# Patient Record
Sex: Male | Born: 1965 | Race: White | Hispanic: No | Marital: Married | Smoking: Former smoker
Health system: Southern US, Community
[De-identification: ages and names within clinical notes are randomized; demographics above are authoritative.]

## PROBLEM LIST (undated history)

## (undated) DIAGNOSIS — N508 Other specified disorders of male genital organs: Secondary | ICD-10-CM

## (undated) DIAGNOSIS — Z87442 Personal history of urinary calculi: Secondary | ICD-10-CM

## (undated) DIAGNOSIS — M549 Dorsalgia, unspecified: Secondary | ICD-10-CM

## (undated) DIAGNOSIS — H538 Other visual disturbances: Secondary | ICD-10-CM

## (undated) DIAGNOSIS — N189 Chronic kidney disease, unspecified: Secondary | ICD-10-CM

## (undated) HISTORY — PX: OTHER SURGICAL HISTORY: SHX169

## (undated) HISTORY — DX: Personal history of urinary calculi: Z87.442

## (undated) HISTORY — DX: Dorsalgia, unspecified: M54.9

## (undated) HISTORY — PX: APPENDECTOMY: SHX54

## (undated) HISTORY — DX: Other visual disturbances: H53.8

## (undated) HISTORY — DX: Other specified disorders of male genital organs: N50.8

## (undated) HISTORY — DX: Chronic kidney disease, unspecified: N18.9

---

## 2002-11-06 ENCOUNTER — Encounter: Payer: Self-pay | Admitting: Emergency Medicine

## 2002-11-06 ENCOUNTER — Emergency Department (HOSPITAL_COMMUNITY): Admission: EM | Admit: 2002-11-06 | Discharge: 2002-11-06 | Payer: Self-pay | Admitting: Emergency Medicine

## 2002-11-21 ENCOUNTER — Emergency Department (HOSPITAL_COMMUNITY): Admission: EM | Admit: 2002-11-21 | Discharge: 2002-11-22 | Payer: Self-pay | Admitting: Emergency Medicine

## 2002-11-22 ENCOUNTER — Encounter: Payer: Self-pay | Admitting: Emergency Medicine

## 2005-01-10 ENCOUNTER — Emergency Department (HOSPITAL_COMMUNITY): Admission: EM | Admit: 2005-01-10 | Discharge: 2005-01-10 | Payer: Self-pay | Admitting: Emergency Medicine

## 2005-01-17 ENCOUNTER — Ambulatory Visit: Payer: Self-pay | Admitting: Internal Medicine

## 2005-01-21 ENCOUNTER — Ambulatory Visit (HOSPITAL_COMMUNITY): Admission: RE | Admit: 2005-01-21 | Discharge: 2005-01-21 | Payer: Self-pay | Admitting: Internal Medicine

## 2005-10-25 ENCOUNTER — Ambulatory Visit: Payer: Self-pay | Admitting: Internal Medicine

## 2006-09-25 ENCOUNTER — Encounter (INDEPENDENT_AMBULATORY_CARE_PROVIDER_SITE_OTHER): Payer: Self-pay | Admitting: *Deleted

## 2006-09-25 ENCOUNTER — Observation Stay (HOSPITAL_COMMUNITY): Admission: EM | Admit: 2006-09-25 | Discharge: 2006-09-25 | Payer: Self-pay | Admitting: *Deleted

## 2007-11-16 IMAGING — CT CT ABDOMEN W/ CM
2 of 5 series · 17 of 46 positions shown, 19 images · IV contrast (omnipaque)
Comparison: none

CLINICAL DATA: Abdominal pain and right lower quadrant pain.  Leukocytosis. 
 ABDOMEN CT WITH CONTRAST:
TECHNIQUE: Multidetector CT imaging of the abdomen was performed following the standard protocol during bolus administration of intravenous contrast.
 Contrast:  100 cc Omnipaque 300
TECHNIQUE: Multidetector CT imaging of the pelvis was performed following the standard protocol during bolus administration of intravenous contrast.

[Series 2: abd_pel 5.0 b40s · axial · 0.68mm/px · z∈[-626,-236]mm · 14 of 88 slices shown, 16 images]
[im 5/88  soft-tissue]
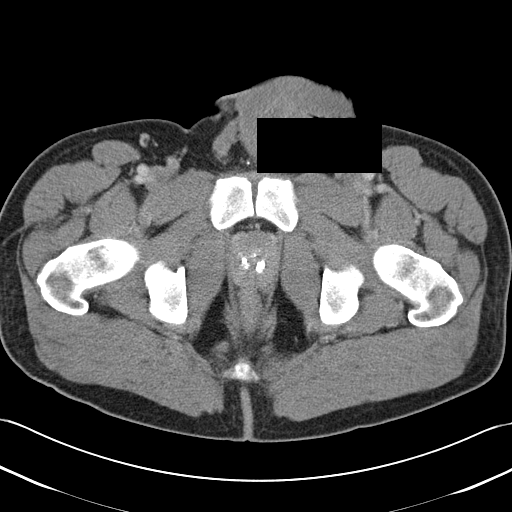
[im 5/88  bone]
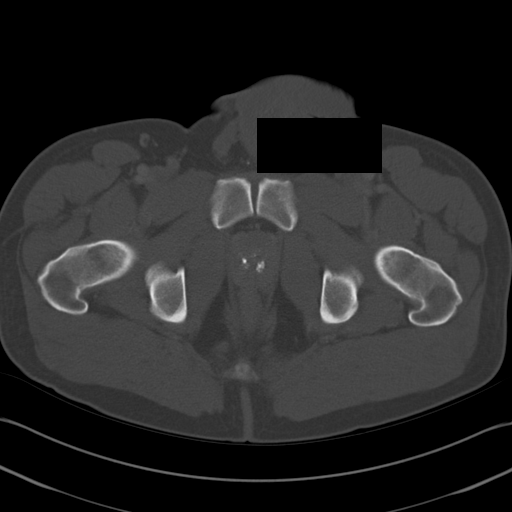
[im 14/88  soft-tissue]
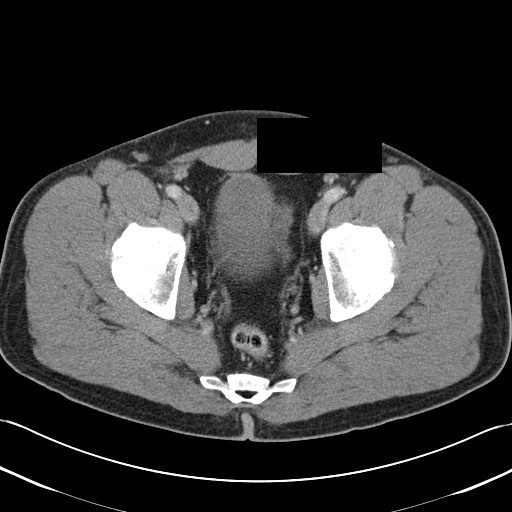
[im 18/88  soft-tissue]
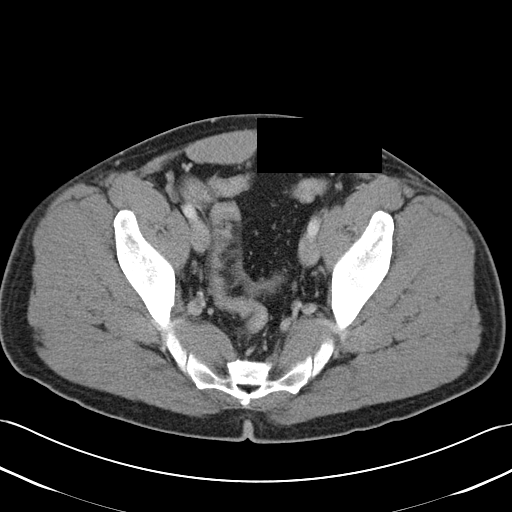
[im 22/88  soft-tissue]
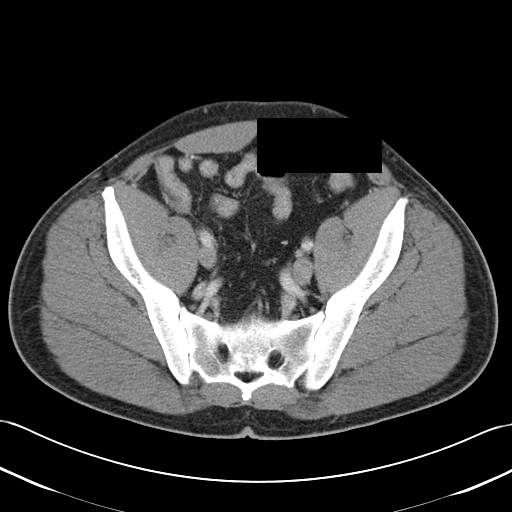
[im 31/88  soft-tissue]
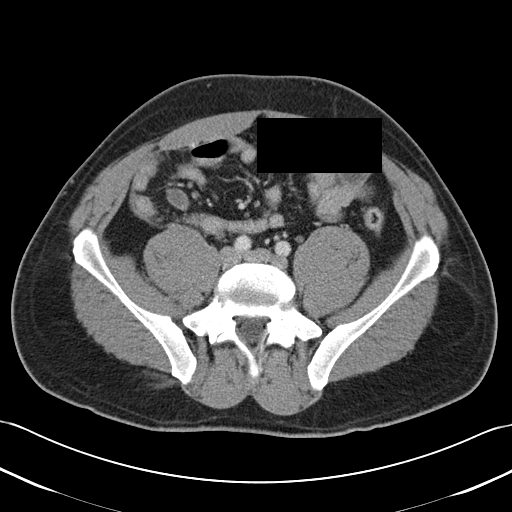
[im 35/88  soft-tissue]
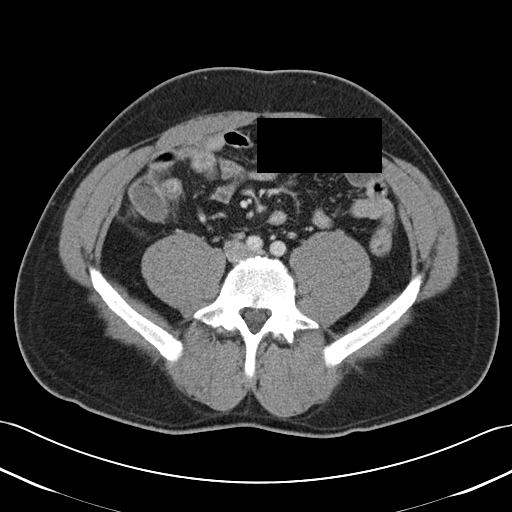
[im 40/88  soft-tissue]
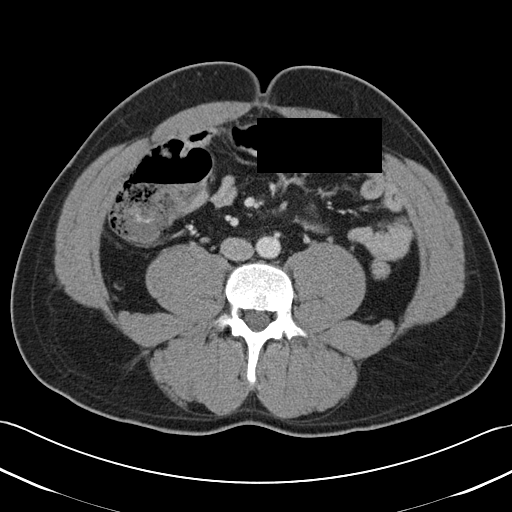
[im 48/88  soft-tissue]
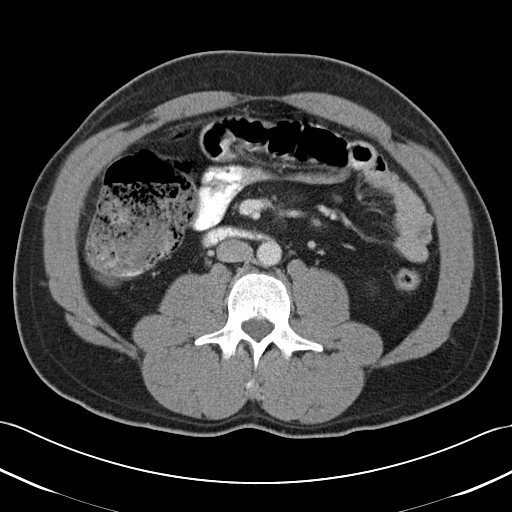
[im 53/88  soft-tissue]
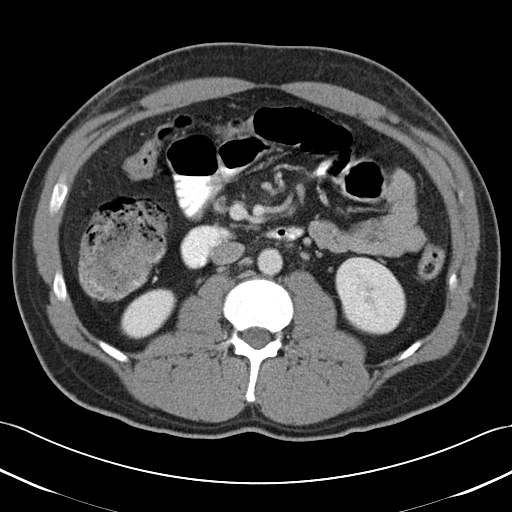
[im 53/88  bone]
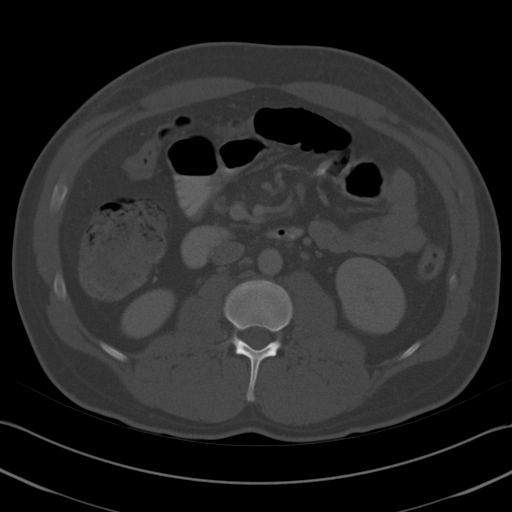
[im 57/88  soft-tissue]
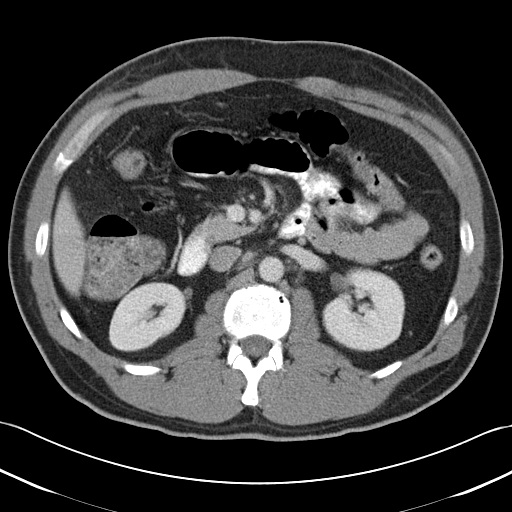
[im 66/88  soft-tissue]
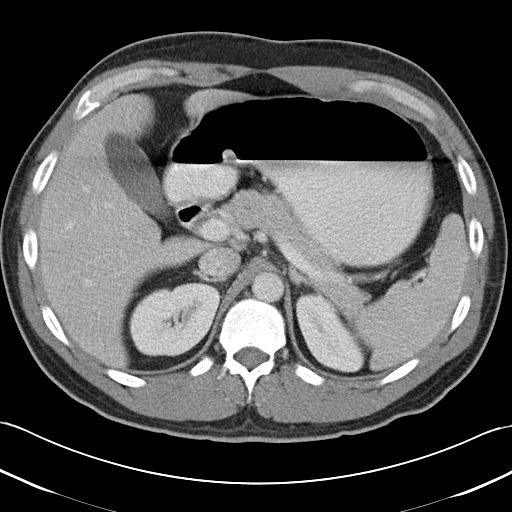
[im 70/88  soft-tissue]
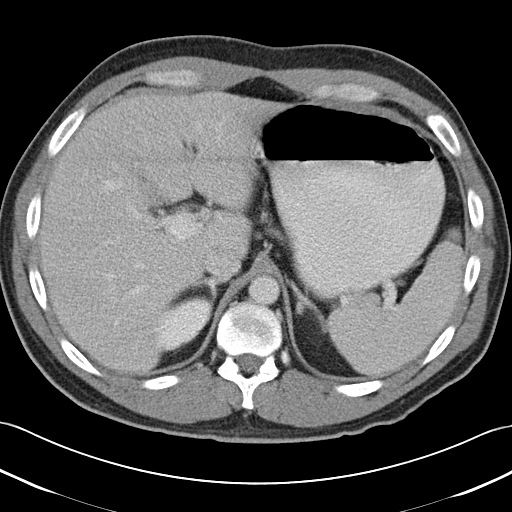
[im 74/88  soft-tissue]
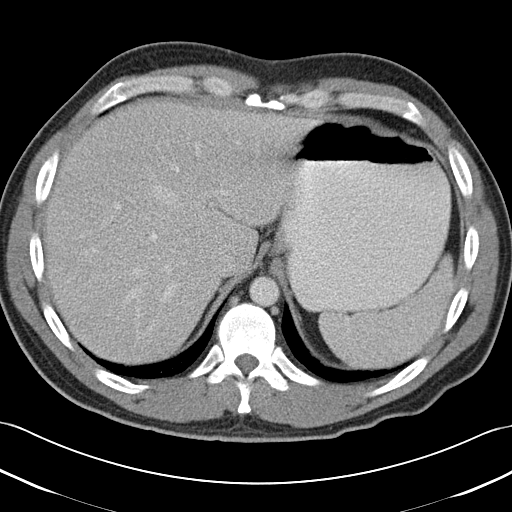
[im 83/88  soft-tissue]
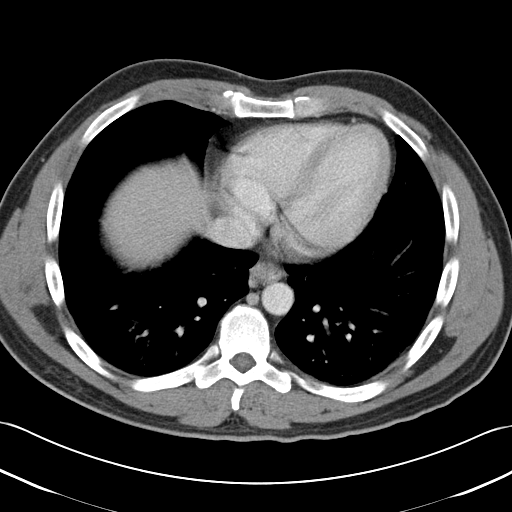

[Series 602: <mpr range> · coronal · 0.86mm/px · 3 of 71 slices shown]
[im 24/71  soft-tissue]
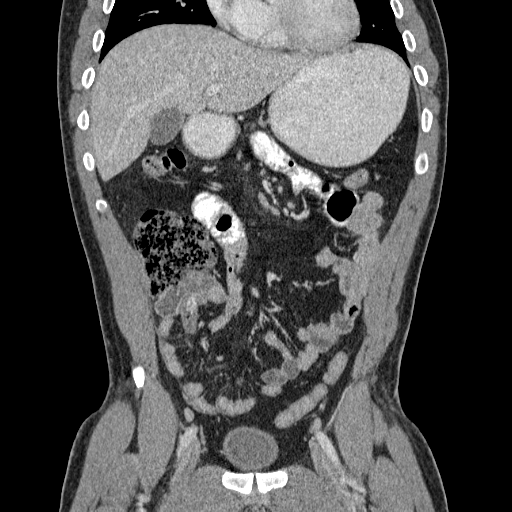
[im 32/71  soft-tissue]
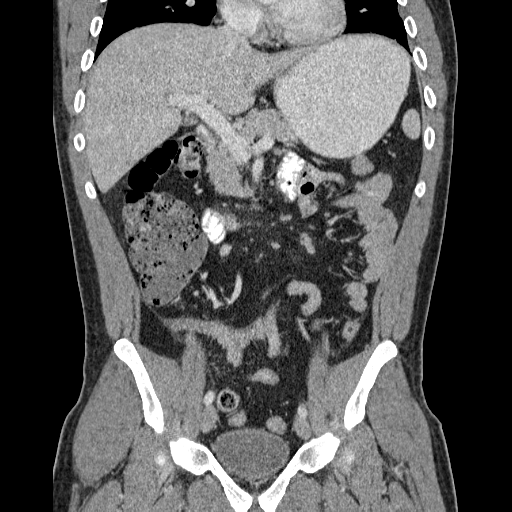
[im 39/71  soft-tissue]
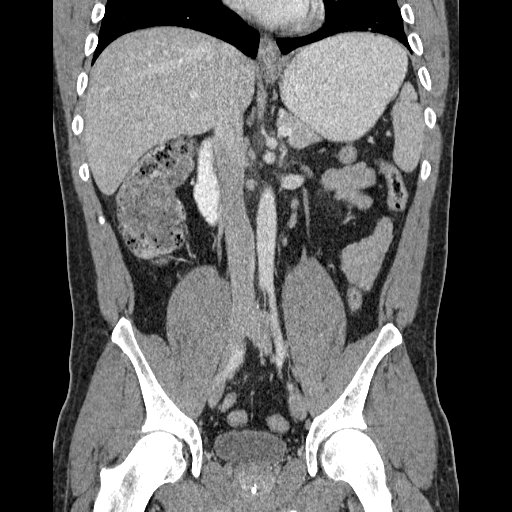

[17 of 46 positions shown; findings below may reference images not displayed]

FINDINGS: The liver and spleen are unremarkable.  Stomach is distended with oral contrast.  The duodenum, pancreas, gallbladder, adrenal glands, and kidneys have normal imaging features.
IMPRESSION: No acute findings in the abdomen.  
 PELVIS CT WITH CONTRAST:
FINDINGS: There is no intraperitoneal free fluid.  No pelvic lymphadenopathy.  The appendix measures 15 mm in diameter and is fluid-filled.  A calcified appendicolith is noted in the base of the appendix.  There is mild periappendiceal inflammation and the lumen is fluid-filled.  
 The terminal ileum is normal in appearance.  Dystrophic calcification is identified within the prostate gland.
IMPRESSION: Features consistent with acute appendicitis and a stone in the base of the appendix.  No evidence for a perforation or periappendiceal abscess at this time.  Results were called directly to Dr. Genaro in the emergency room at the time of study interpretation.

## 2008-03-28 ENCOUNTER — Ambulatory Visit: Payer: Self-pay | Admitting: Internal Medicine

## 2008-03-28 DIAGNOSIS — M549 Dorsalgia, unspecified: Secondary | ICD-10-CM | POA: Insufficient documentation

## 2008-03-28 DIAGNOSIS — N508 Other specified disorders of male genital organs: Secondary | ICD-10-CM | POA: Insufficient documentation

## 2008-03-28 DIAGNOSIS — Z87442 Personal history of urinary calculi: Secondary | ICD-10-CM | POA: Insufficient documentation

## 2008-03-28 HISTORY — DX: Other specified disorders of male genital organs: N50.8

## 2008-03-28 HISTORY — DX: Dorsalgia, unspecified: M54.9

## 2008-03-28 HISTORY — DX: Personal history of urinary calculi: Z87.442

## 2008-04-11 ENCOUNTER — Encounter: Admission: RE | Admit: 2008-04-11 | Discharge: 2008-04-11 | Payer: Self-pay | Admitting: Internal Medicine

## 2009-06-02 IMAGING — US US SCROTUM
1 series · 14 of 25 positions shown · non-contrast
Comparison: None.

CLINICAL DATA: Palpable abnormality in the right hemiscrotum for
approximately 2 months which has not changed over that time.

ULTRASOUND OF SCROTUM 04/11/2008:
TECHNIQUE: Complete ultrasound examination of the testicles,
epididymis, and other scrotal structures was performed.

[Series 1: us scrotum · 0.07mm/px · 14 of 46 slices shown]
[im 1/46]
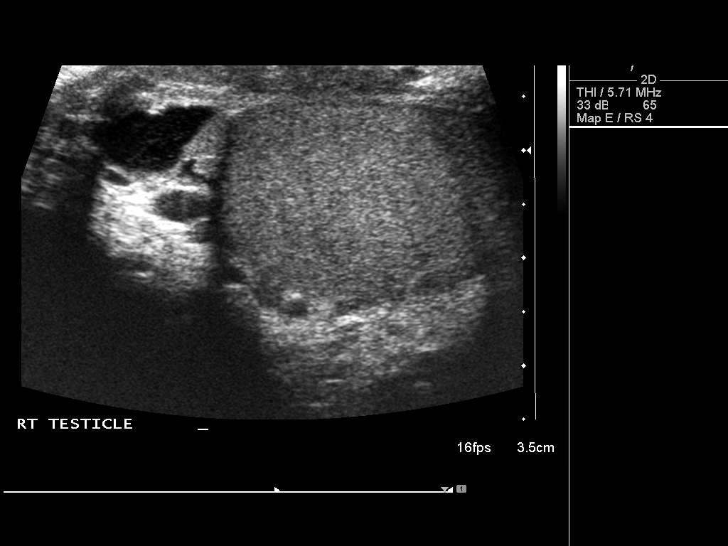
[im 4/46]
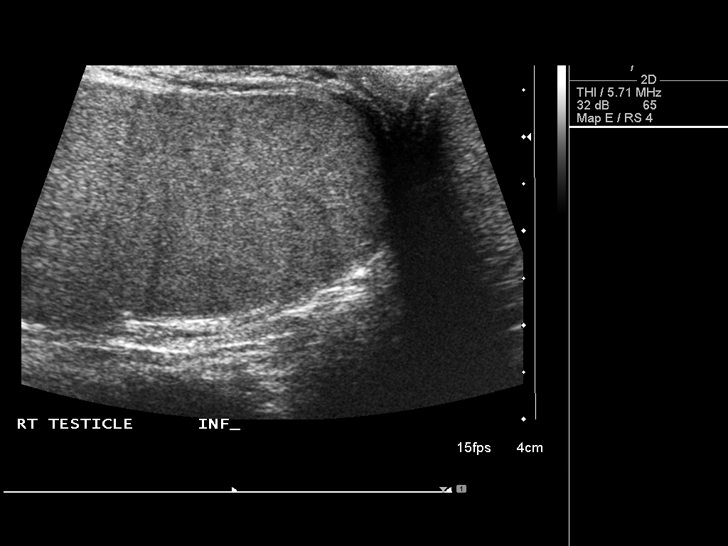
[im 8/46]
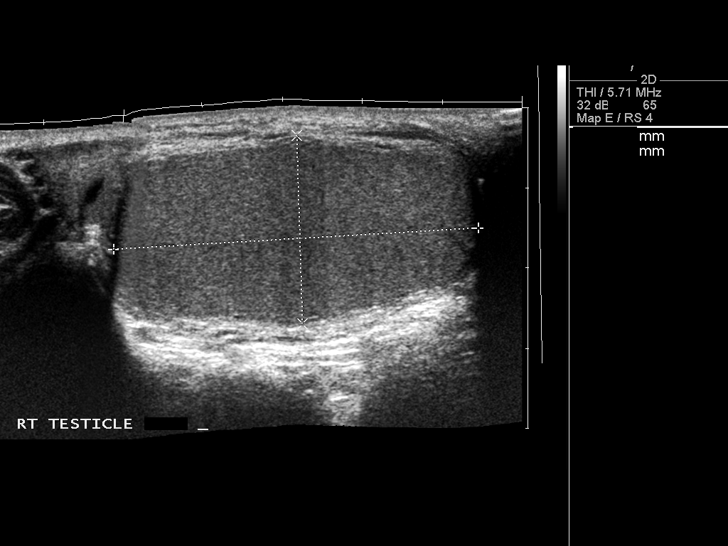
[im 12/46]
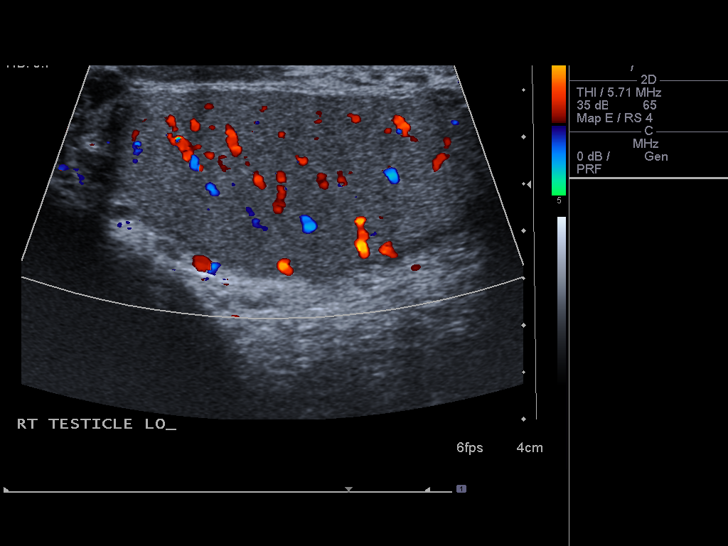
[im 16/46]
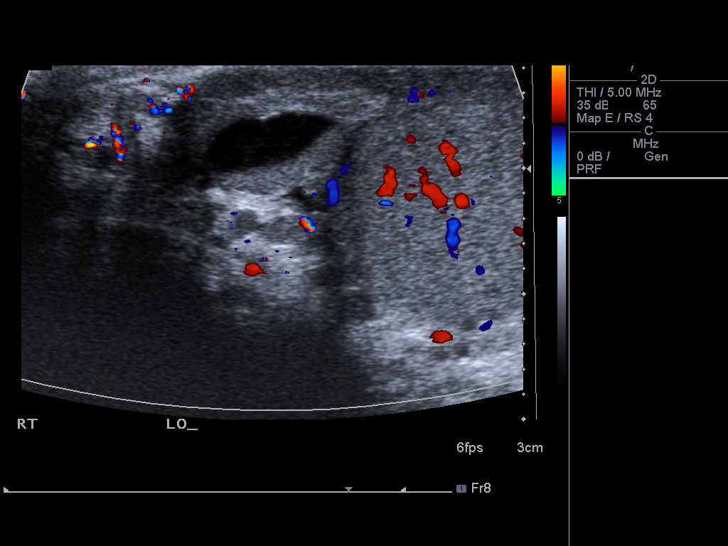
[im 17/46]
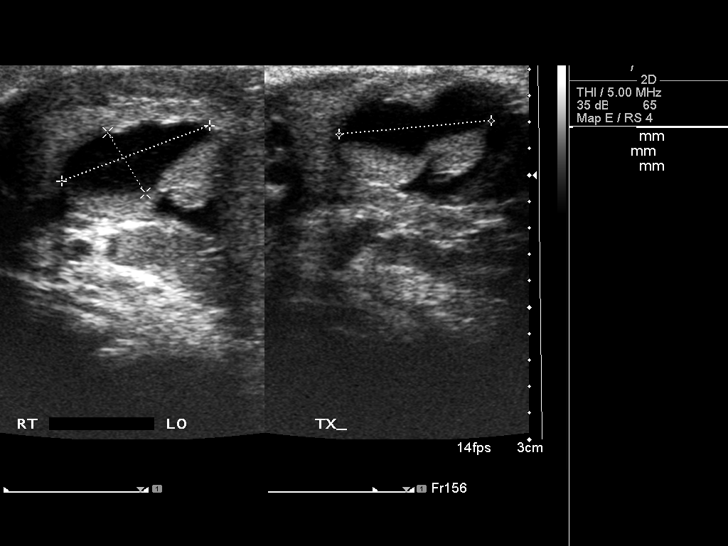
[im 21/46]
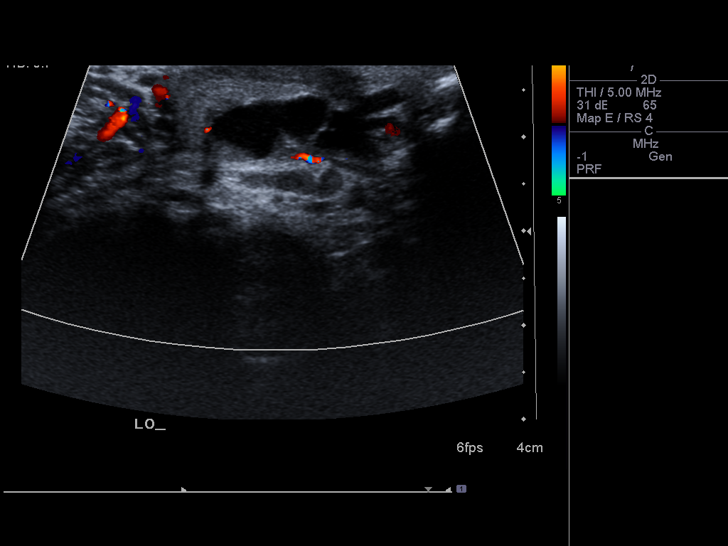
[im 25/46]
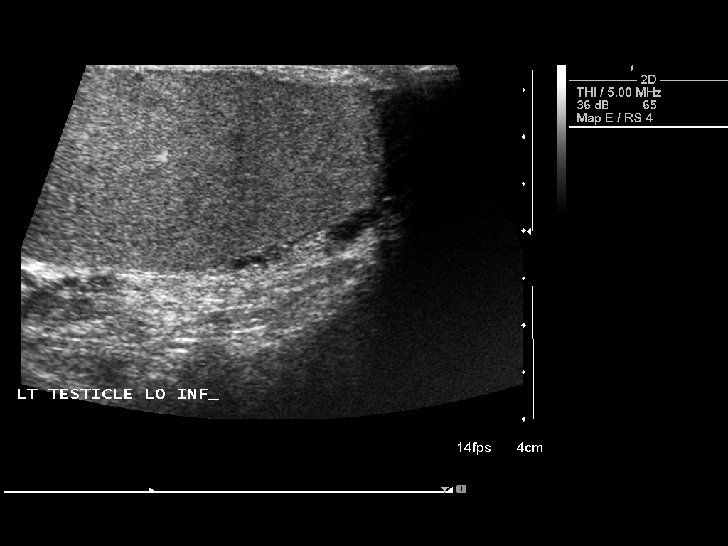
[im 29/46]
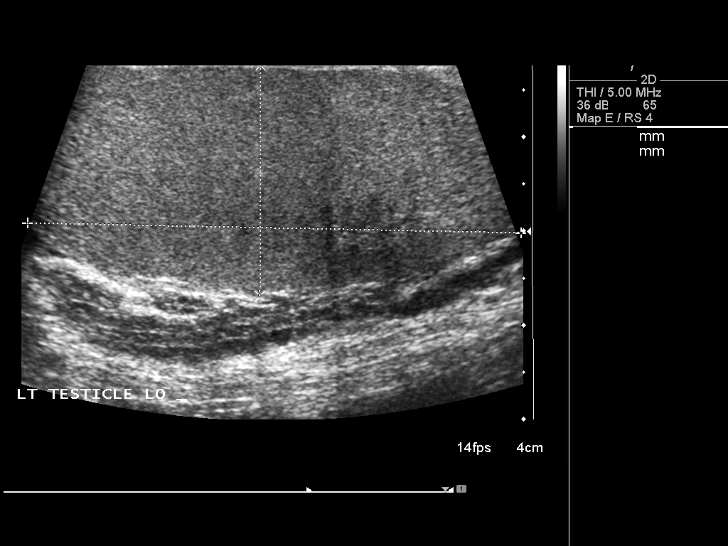
[im 31/46]
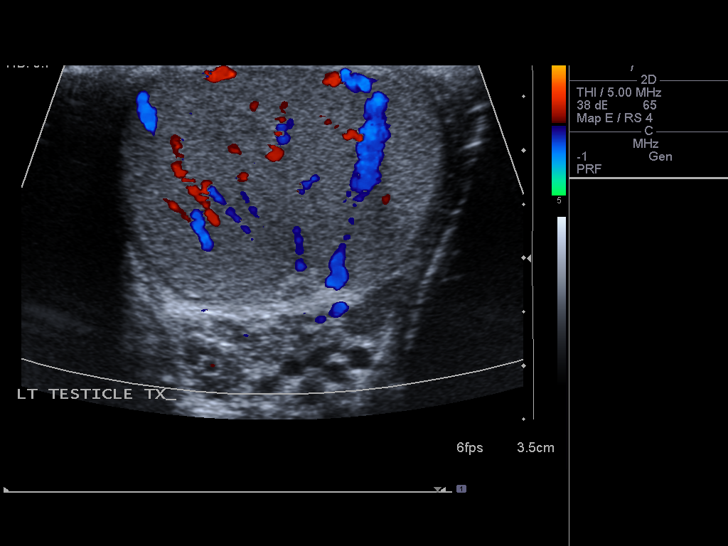
[im 34/46]
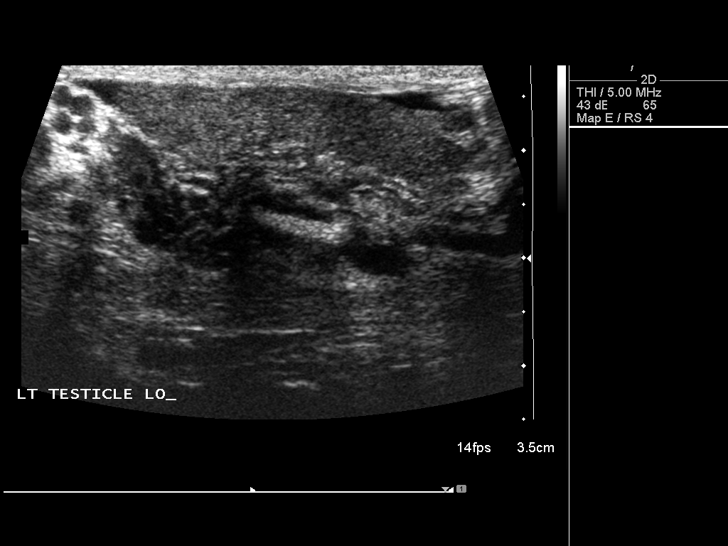
[im 38/46]
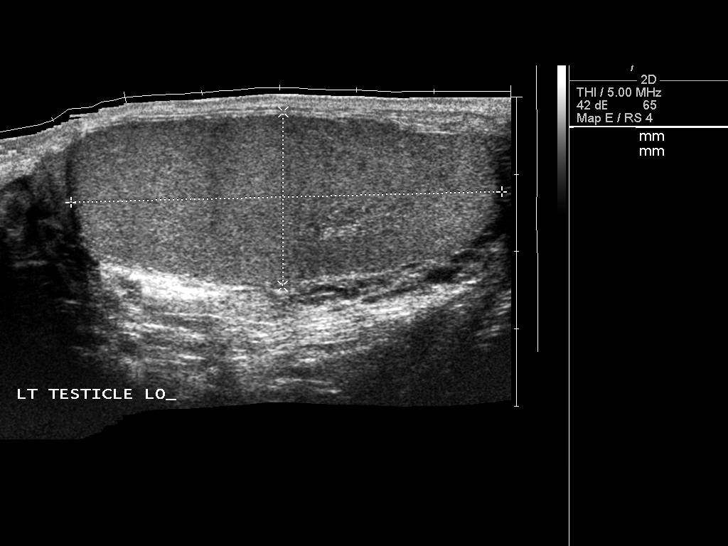
[im 42/46]
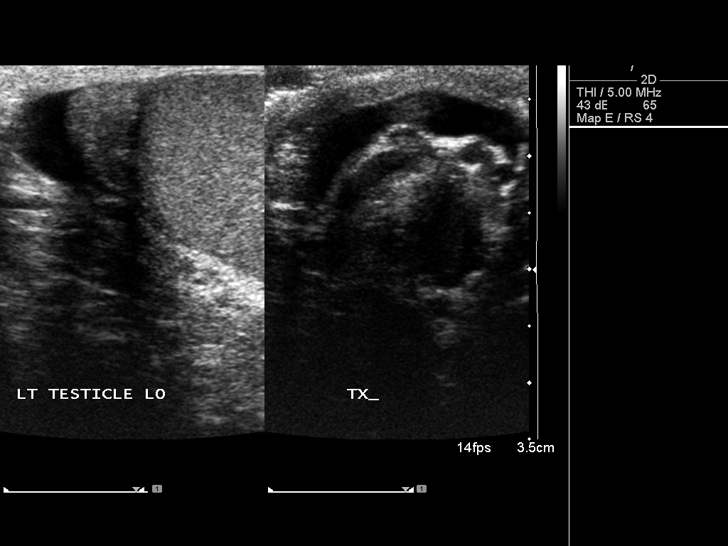
[im 46/46]
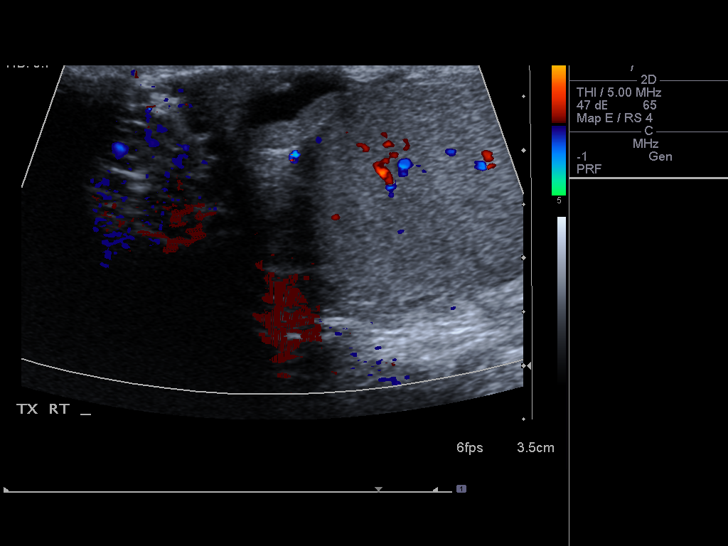

[14 of 25 positions shown; findings below may reference images not displayed]

FINDINGS: Right testicle normal in size and echotexture without
focal parenchymal abnormality, measuring approximately 4.7 x 2.2 x
3.3 cm.  Normal color Doppler signal identified..

Several cystic foci in the head of the right epididymis, the
largest approximating 11 mm. No evidence of hyperemia involving the
right epididymis.

Left testicle normal in size and echotexture measuring
approximately 5.6 x 2.3 x 3.1 cm.  Normal color Doppler signal
identified.

Left epididymis normal in appearance without evidence of hyperemia.

No evidence of hydrocele on either side.  No evidence of varicocele
with Valsalva.
IMPRESSION: 1.  Several cysts or spermatoceles in the right epididymis, likely
accounting for the palpable abnormality in the right scrotum.
Please correlate clinically.
2.  Otherwise normal scrotal ultrasound.

## 2010-07-20 NOTE — Assessment & Plan Note (Signed)
Summary: MEN PROBLEM/$50/PN   Vital Signs:  Patient Profile:   45 Years Old Male Weight:      175 pounds Temp:     98.0 degrees F oral Pulse rate:   88 / minute BP sitting:   118 / 68  (left arm) Cuff size:   regular  Vitals Entered By: Payton Spark CMA (March 28, 2008 9:18 AM)                 Chief Complaint:  man problems.  History of Present Illness: here with 2 mo lump that occasionally is somewhat sore by the end of the workday just above the right testicle area; no other GU symptoms; also with 1 wk left lower back pain without tenderness or radicular, but seems worse to first get up in the am with stiffness and mild discomfort, better at the end of the day    Updated Prior Medication List: DICLOFENAC SODIUM 75 MG TBEC (DICLOFENAC SODIUM) 1 by mouth two times a day as needed  Current Allergies (reviewed today): No known allergies   Past Medical History:    Reviewed history and no changes required:       Nephrolithiasis, hx of x 3  Past Surgical History:    Reviewed history and no changes required:       Inguinal herniorrhaphy       Appendectomy       s/p tubes in ears as child   Family History:    Reviewed history and no changes required:       father with renal stones, HTN       grandmother with breast cancer  Social History:    Reviewed history and no changes required:       Married       1 child       work - Occupational hygienist       Current Smoker       Alcohol use-yes   Risk Factors:  Tobacco use:  current Alcohol use:  yes   Review of Systems       all otherwise negative    Physical Exam  General:     alert and well-developed.   Head:     Normocephalic and atraumatic without obvious abnormalities. No apparent alopecia or balding. Eyes:     No corneal or conjunctival inflammation noted. EOMI. Perrla.  Ears:     External ear exam shows no significant lesions or deformities.  Otoscopic examination reveals clear  canals, tympanic membranes are intact bilaterally without bulging, retraction, inflammation or discharge. Hearing is grossly normal bilaterally. Nose:     External nasal examination shows no deformity or inflammation. Nasal mucosa are pink and moist without lesions or exudates. Mouth:     Oral mucosa and oropharynx without lesions or exudates.  Teeth in good repair. Neck:     No deformities, masses, or tenderness noted. Lungs:     Normal respiratory effort, chest expands symmetrically. Lungs are clear to auscultation, no crackles or wheezes. Heart:     Normal rate and regular rhythm. S1 and S2 normal without gallop, murmur, click, rub or other extra sounds. Genitalia:     normal except for < 1 cm nontender but not hard lump near the epididymus just above the right testicle Msk:     lumbar bilat nontender paravertebral area, FROM Extremities:     no edema, no ulcers  Neurologic:     cranial nerves II-XII intact and strength  normal in all extremities.      Impression & Recommendations:  Problem # 1:  BACK PAIN (ICD-724.5)  His updated medication list for this problem includes:    Diclofenac Sodium 75 Mg Tbec (Diclofenac sodium) .Marland Kitchen... 1 by mouth two times a day as needed treat as above, f/u any worsening signs or symptoms , c/w MSK strain vs DJD lumbar  Problem # 2:  SCROTAL MASS (ICD-608.89) check u/s - but most c/w epididymal cyst., o/w tx as above as needed pain Orders: Radiology Referral (Radiology)   Complete Medication List: 1)  Diclofenac Sodium 75 Mg Tbec (Diclofenac sodium) .Marland Kitchen.. 1 by mouth two times a day as needed   Patient Instructions: 1)  You will be contacted about the referral(s) to: Testicle ultrasound test 2)  Please take all new medications as prescribed 3)  Continue all medications that you may have been taking previously 4)  Please schedule a follow-up appointment as needed.   Prescriptions: DICLOFENAC SODIUM 75 MG TBEC (DICLOFENAC SODIUM) 1 by mouth  two times a day as needed  #60 x 5   Entered and Authorized by:   Corwin Levins MD   Signed by:   Corwin Levins MD on 03/28/2008   Method used:   Print then Give to Patient   RxID:   [id number]  ]

## 2010-11-05 NOTE — H&P (Signed)
NAMEGUMECINDO, HOPKIN NO.:  [id number]   MEDICAL RECORD NO.:  [id number]          PATIENT TYPE:  EMS   LOCATION:  ED                           FACILITY:  [facility]   PHYSICIAN:  Ardeth Sportsman, MD     DATE OF BIRTH:  1966-06-19   DATE OF ADMISSION:  09/24/2006  DATE OF DISCHARGE:                              HISTORY & PHYSICAL   PRIMARY CARE PHYSICIAN:  Not available.   REQUESTING PHYSICIAN:  Sheppard Penton. Stacie Acres, M.D.   SURGEON:  Ardeth Sportsman, M.D.   CHIEF COMPLAINT:  Abdominal pain and probable appendicitis.   HISTORY OF PRESENT ILLNESS:  Mr. Surgeon is a 45 year old male, moderate  smoker, otherwise pretty healthy who noted about 18 hours ago he started  getting some vague abdominal pain with some decreased appetite. The pain  intensified, increased and is now primarily instead of being in the mid  abdomen now in the right lower quadrant. The pain worsened to the point  that his wife and family were concerned and brought him to the emergency  room for evaluation. He has been rather nauseated but no emesis. No sick  contacts or travel history. He has really not had anything like this  before. No hematochezia or melena. No history of inflammatory bowel  disease or other issues.   PAST MEDICAL HISTORY:  1. Tobacco abuse.  2. Kidney stones status post 3 evacuations in the past.  3. History of pneumonia in the past.   PAST SURGICAL HISTORY:  Kidney stone removal and right inguinal hernia  repaired at age 72.   SOCIAL HISTORY:  He has got probably about a 30-pack year history of  tobacco, currently smoking about 1-1/2 packs per day. No history of  alcohol or other drug use. He works as a Nurse, learning disability. He is in a stable relationship and has one  child. His family is here with him.   ALLERGIES:  No true allergies although Dilaudid does give him some  nausea.   MEDICATIONS:  He has been taking some ibuprofen p.r.n.   REVIEW  OF SYSTEMS:  Some chills and sweats but no major fevers. No  weight gain or weight loss. Eyes, ENT, cardiac and respiratory are  negative. He has good exercise tolerance with no exertional chest pain  or shortness of breath. GI:  As noted per HPI otherwise negative.  Hepatic, renal, endocrine, GU, testicular, neurologic, musculoskeletal,  allergic, dermatologic, psychiatric otherwise negative.   PHYSICAL EXAMINATION:  VITAL SIGNS:  Temperature is 98.0, pulse 86,  respirations 18, 100% sats on room air. He is about 9/10 pain. Blood  pressure is 139/87.  GENERAL:  He is a well-developed, well-nourished, obese male with a BMI  of 30, uncomfortable but not frankly toxic.  PSYCHE:  He is pleasant, interactive with good insight and pretty good  intelligence. No evidence of any dementia, delirium, psychosis or  paranoia.  NEUROLOGIC:  Cranial nerves II-XII are intact. Hand grip is 5/5 equal  and symmetric. No resting or tension tremors.  HEENT:  Eyes, pupils equal round and reactive  to light. Extraocular  movements intact. Sclera not icteric or injected. HEENT is normocephalic  with no facial asymmetric. Mucous membranes are dry but nasopharynx and  oropharynx clear. His dentition is pretty good.  NECK:  Supple without any masses. Trachea is midline, it appears to be  normal.  HEART:  Regular rate and rhythm. No murmur, gallop or rub.  CHEST:  Clear to auscultation bilaterally. No wheezes, rales or rhonchi.  No pain on rib or sternal compression.  ABDOMEN:  Soft, obese but not particularly distended. He has no evidence  of umbilical hernia. He does have tenderness in his lower abdomen  particularly in the right lower quadrant with some voluntary guarding  and classic tenderness at McBurney's point with some peritoneal signs  including irritation with cough and percussion.  GENITAL:  Normal external male genitalia, circumcised, testes descended  bilaterally with no evidence of any inguinal  hernias.  RECTAL:  Deferred per patient request.  EXTREMITIES:  No significant clubbing or cyanosis.  MUSCULOSKELETAL:  Full range of motion shoulders, elbows, wrists, hips,  knees and ankles.  LYMPH:  No head, neck, axillary or groin lymphadenopathy.  SKIN:  No petechia, no purpura, no other significant disorders or  lesions   STUDIES:  He has a white count elevated around 15 with a hemoglobin of  17.8. His urinalysis is negative. Chemistries are in the normal range.  He does have an EKG which shows a normal sinus rhythm with no  significant ST or T changes. Chest x-ray is pending. He does have a CT  scan of the abdomen and pelvis which shows a thickened appendix with a  fecalith at the base. There is no evidence of any free air or free fluid  or abscess. There is no evidence of any bowel obstruction. The rest of  his abdominal skin is unremarkable.   ASSESSMENT/PLAN:  A 45 year old man with about an 18-history and exam  and CT scan classic for appendicitis.   The anatomy and embryology of the digestive tract was explained,  pathophysiology of appointment was explained. Options discussed and  recommendations were made for diagnostic laparoscopy with appendectomy.  Risks such as stroke, heart attack, deep venous thrombosis, pulmonary  embolism, and death were discussed. Risks such as bleeding, need for  transfusion, wound infection, abscess, injury to the organs, incisional  hernia or pain, prolonged hospital stay, need for oral antibiotics and  other risks were discussed. Questions were answered and he and his  family expressed their understanding and wished to proceed.      Ardeth Sportsman, MD  Electronically Signed     SCG/MEDQ  D:  09/25/2006  T:  09/25/2006  Job:  [id number]

## 2010-11-05 NOTE — Op Note (Signed)
Joel Price, Joel Price NO.:  [id number]   MEDICAL RECORD NO.:  [id number]          PATIENT TYPE:  EMS   LOCATION:  ED                           FACILITY:  [facility]   PHYSICIAN:  Ardeth Sportsman, MD     DATE OF BIRTH:  [birth year]   DATE OF PROCEDURE:  09/25/2006  DATE OF DISCHARGE:                               OPERATIVE REPORT   EMERGENCY PHYSICIAN:  Sheppard Penton. Stacie Acres, M.D.   PREOPERATIVE DIAGNOSIS:  Acute appendicitis.   POSTOPERATIVE DIAGNOSIS:  Acute appendicitis (nonperforated).   OPERATION PERFORMED:  Diagnostic laparoscopy with appendectomy.   SURGEON:  Ardeth Sportsman, MD   ASSISTANT:  None.   SPECIMENS:  Appendix.   ESTIMATED BLOOD LOSS:  Less than 5 mL.   ANESTHESIA:  1. General anesthesia.  2. Local anesthetic in a field block around all port sites.   COMPLICATIONS:  None apparent.   INDICATIONS FOR PROCEDURE:  Joel Price is a 45 year old male with  abdominal pain that migrated to the right lower quadrant with nausea and  anorexia with physical exam and CT scan findings concerning for acute  appendicitis.   Anatomy and embryology of digestive tract formation and appendiceal  formation and physiology was explained.  Pathophysiology of appendicitis  was explained.  Options were discussed and recommendations made for  diagnostic laparoscopy with appendectomy.  Risks of stroke, heart  attack, deep venous thrombosis, pulmonary embolus and death were  discussed.  Risks such as bleeding, blood transfusion, wound infection,  abscess, injury to other organs and other risks were discussed.  Questions were answered and they agreed to proceed.   OPERATIVE FINDINGS:  He had a short, thickened appendix with some  stranding, but no abscess or obvious perforation.  There was no massive  suppuration.   DESCRIPTION OF PROCEDURE:  Informed consent was confirmed.  He was  started on IV antibiotics.  He had sequential compression devices active  for the entire  case.  He underwent general anesthesia without difficulty  after informed consent was confirmed.  He was positioned supine with the  left arm tucked.  His abdomen was clipped, prepped and draped in sterile  fashion.  He had a Foley catheter placed with good return of normal  urine.   Entry was gained to the abdomen through a optical entry in the right  upper quadrant with the patient  in steep reverse Trendelenburg, right  side up.  Capnoperitoneum to 15 mmHg provided good abdominal  insufflation.  Under direct visualization a 5 mm port was placed through  the umbilicus and a 12 mm port was placed in the left lower suprapubic  region.   Diagnostic laparoscopy was performed.  Liver, gallbladder, omentum,  small bowel, colon, rectum, abdominal wall all appeared to be normal.  Going down to the junction near the terminal ileum and cecum, a blind-  ended pouch consistent with appendix could be seen which had some  inflammation and it was adherent to the mesentery of the small bowel and  some epiploic appendages.  There was no massive pockets of pus or  perforation.  This was consistent with early appendicitis.  The appendix  could be elevated anteriorly.  A window was made at the base of the  mesentery.  The appendix was transected using a laparoscopic stapler to  good effect.  The appendiceal mesentery was cauterized aggressively with  good result.  The appendix was placed inside thumb of a #8 glove and  removed inside the glove through the suprapubic port.  The suprapubic  port was reapproximated using a 0-0 Vicryl using a laparoscopic suture  passer under direct visualization.   Copious irrigation was done.  Nice clear return.  Staple line was  inspected.  It was intact with no evidence of bleeding at the end.  Over  a liter of irrigation was done.  Capnoperitoneum was evacuated.  Fascial  stitch was tied down.  Skin was closed using 4-0 Monocryl stitches,  sterile dressing was  applied.  The patient was extubated and taken to  recovery room in stable condition.   I explained the operative findings to patient's family, expected goals  for postoperative recovery were discussed.  Questions answered and they  expressed understanding and appreciation.      Ardeth Sportsman, MD  Electronically Signed     SCG/MEDQ  D:  09/25/2006  T:  09/25/2006  Job:  [id number]   cc:   Sheppard Penton. Stacie Acres, M.D.  Fax: [phone]

## 2010-12-05 ENCOUNTER — Encounter: Payer: Self-pay | Admitting: Internal Medicine

## 2010-12-05 DIAGNOSIS — Z0000 Encounter for general adult medical examination without abnormal findings: Secondary | ICD-10-CM | POA: Insufficient documentation

## 2010-12-05 DIAGNOSIS — Z0001 Encounter for general adult medical examination with abnormal findings: Secondary | ICD-10-CM | POA: Insufficient documentation

## 2010-12-06 ENCOUNTER — Ambulatory Visit (INDEPENDENT_AMBULATORY_CARE_PROVIDER_SITE_OTHER): Payer: BC Managed Care – PPO | Admitting: Internal Medicine

## 2010-12-06 ENCOUNTER — Other Ambulatory Visit (INDEPENDENT_AMBULATORY_CARE_PROVIDER_SITE_OTHER): Payer: BC Managed Care – PPO

## 2010-12-06 ENCOUNTER — Encounter: Payer: Self-pay | Admitting: Internal Medicine

## 2010-12-06 ENCOUNTER — Ambulatory Visit: Payer: Self-pay | Admitting: Internal Medicine

## 2010-12-06 VITALS — BP 120/72 | HR 73 | Temp 98.2°F | Ht 66.0 in | Wt 180.1 lb

## 2010-12-06 DIAGNOSIS — H538 Other visual disturbances: Secondary | ICD-10-CM

## 2010-12-06 DIAGNOSIS — Z0000 Encounter for general adult medical examination without abnormal findings: Secondary | ICD-10-CM

## 2010-12-06 DIAGNOSIS — Z23 Encounter for immunization: Secondary | ICD-10-CM

## 2010-12-06 HISTORY — DX: Other visual disturbances: H53.8

## 2010-12-06 LAB — CBC WITH DIFFERENTIAL/PLATELET
Basophils Absolute: 0 10*3/uL (ref 0.0–0.1)
Basophils Relative: 0.4 % (ref 0.0–3.0)
Eosinophils Absolute: 0.1 10*3/uL (ref 0.0–0.7)
Eosinophils Relative: 1 % (ref 0.0–5.0)
HCT: 47.6 % (ref 39.0–52.0)
Hemoglobin: 16.8 g/dL (ref 13.0–17.0)
Lymphocytes Relative: 35.7 % (ref 12.0–46.0)
Lymphs Abs: 2.4 10*3/uL (ref 0.7–4.0)
MCHC: 35.3 g/dL (ref 30.0–36.0)
MCV: 91.1 fl (ref 78.0–100.0)
Monocytes Absolute: 0.5 10*3/uL (ref 0.1–1.0)
Monocytes Relative: 7.8 % (ref 3.0–12.0)
Neutro Abs: 3.7 10*3/uL (ref 1.4–7.7)
Neutrophils Relative %: 55.1 % (ref 43.0–77.0)
Platelets: 191 10*3/uL (ref 150.0–400.0)
RBC: 5.23 Mil/uL (ref 4.22–5.81)
RDW: 12.6 % (ref 11.5–14.6)
WBC: 6.7 10*3/uL (ref 4.5–10.5)

## 2010-12-06 LAB — LIPID PANEL
Cholesterol: 180 mg/dL (ref 0–200)
HDL: 26.6 mg/dL — ABNORMAL LOW (ref >39.00)
LDL Cholesterol: 121 mg/dL — ABNORMAL HIGH (ref 0–99)
Total CHOL/HDL Ratio: 7
Triglycerides: 164 mg/dL — ABNORMAL HIGH (ref 0.0–149.0)
VLDL: 32.8 mg/dL (ref 0.0–40.0)

## 2010-12-06 LAB — HEPATIC FUNCTION PANEL
ALT: 45 U/L (ref 0–53)
AST: 35 U/L (ref 0–37)
Albumin: 4.8 g/dL (ref 3.5–5.2)
Alkaline Phosphatase: 70 U/L (ref 39–117)
Bilirubin, Direct: 0.1 mg/dL (ref 0.0–0.3)
Total Bilirubin: 0.6 mg/dL (ref 0.3–1.2)
Total Protein: 7.9 g/dL (ref 6.0–8.3)

## 2010-12-06 LAB — URINALYSIS, ROUTINE W REFLEX MICROSCOPIC
Bilirubin Urine: NEGATIVE
Hgb urine dipstick: NEGATIVE
Ketones, ur: NEGATIVE
Leukocytes, UA: NEGATIVE
Nitrite: NEGATIVE
Specific Gravity, Urine: 1.015 (ref 1.000–1.030)
Total Protein, Urine: NEGATIVE
Urine Glucose: NEGATIVE
Urobilinogen, UA: 0.2 (ref 0.0–1.0)
pH: 7.5 (ref 5.0–8.0)

## 2010-12-06 LAB — BASIC METABOLIC PANEL
BUN: 16 mg/dL (ref 6–23)
CO2: 25 mEq/L (ref 19–32)
Calcium: 9.1 mg/dL (ref 8.4–10.5)
Chloride: 99 mEq/L (ref 96–112)
Creatinine, Ser: 1 mg/dL (ref 0.4–1.5)
GFR: 88.93 mL/min (ref >60.00)
Glucose, Bld: 94 mg/dL (ref 70–99)
Potassium: 3.7 mEq/L (ref 3.5–5.1)
Sodium: 140 mEq/L (ref 135–145)

## 2010-12-06 LAB — TSH: TSH: 1.04 u[IU]/mL (ref 0.35–5.50)

## 2010-12-06 LAB — PSA: PSA: 0.46 ng/mL (ref 0.10–4.00)

## 2010-12-06 MED ORDER — TETANUS-DIPHTH-ACELL PERTUSSIS 5-2.5-18.5 LF-MCG/0.5 IM SUSP
0.5000 mL | Freq: Once | INTRAMUSCULAR | Status: AC
  Administered 2010-12-06: 0.5 mL via INTRAMUSCULAR

## 2010-12-06 NOTE — Patient Instructions (Signed)
You had the tetanus shot today You are otherwise up to date with prevention Please go to LAB in the Basement for the blood and/or urine tests to be done today Please call the phone number [phone] (the PhoneTree System) for results of testing in 2-3 days;  When calling, simply dial the number, and when prompted enter the MRN number above (the Medical Record Number) and the # key, then the message should start. You will be contacted regarding the referral for: Opthomology - asap for the left eye Please return in 1 year for your yearly visit, or sooner if needed, with Lab testing done 3-5 days before

## 2010-12-06 NOTE — Progress Notes (Signed)
Subjective:    Patient ID: Joel Price, male    DOB: Mar 11, 1966, 45 y.o.   MRN: [id number]  HPI Here for wellness and f/u;  Overall doing ok;  Pt denies CP, worsening SOB, DOE, wheezing, orthopnea, PND, worsening LE edema, palpitations, dizziness or syncope.  Pt denies neurological change such as new Headache, facial or extremity weakness.  Pt denies polydipsia, polyuria, or low sugar symptoms. Pt states overall good compliance with treatment and medications, good tolerability, and trying to follow lower cholesterol diet.  Pt denies worsening depressive symptoms, suicidal ideation or panic. No fever, wt loss, night sweats, loss of appetite, or other constitutional symptoms.  Pt states good ability with ADL's, low fall risk, home safety reviewed and adequate, no significant changes in hearing or vision, and occasionally active with exercise.  Also with 3 wks sense of foreign body sensation, saw optometry who pt said mentioned "scratches" to the lower white part of the eye but no foreign body;  Then developed a haziness that persists to the lower left eye vision that persists today, also with "wavy line" like radiating heat to part of the visial field as well.  No pain, no fever, HA though did have some left earache a few days ago that resolved "like sinus pressure" after taking benadryl.  No right vision problem.  No trauma or prior hx.  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 Past Medical History  Diagnosis Date  . BACK PAIN 03/28/2008  . 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 SCROTAL MASS 03/28/2008  . Blurred vision, left eye 12/06/2010   Past Surgical History  Procedure Date  . Inguinal herniorrhapy   . Appendectomy   . S/p tubes in ears as child     reports that he has been smoking.  He does not have any smokeless tobacco history on file. He reports that he drinks alcohol. His drug  history not on file. family history includes Cancer in his other and Hypertension in his mother. No Known Allergies No current outpatient prescriptions on file prior to visit.   Review of Systems Review of Systems  Constitutional: Negative for diaphoresis, activity change, appetite change and unexpected weight change.  HENT: Negative for hearing loss, ear pain, facial swelling, mouth sores and neck stiffness.   Eyes: Negative for pain, redness  Respiratory: Negative for shortness of breath and wheezing.   Cardiovascular: Negative for chest pain and palpitations.  Gastrointestinal: Negative for diarrhea, blood in stool, abdominal distention and rectal pain.  Genitourinary: Negative for hematuria, flank pain and decreased urine volume.  Musculoskeletal: Negative for myalgias and joint swelling.  Skin: Negative for color change and wound.  Neurological: Negative for syncope and numbness.  Hematological: Negative for adenopathy.  Psychiatric/Behavioral: Negative for hallucinations, self-injury, decreased concentration and agitation.      Objective:   Physical Exam BP 120/72  Pulse 73  Temp(Src) 98.2 F (36.8 C) (Oral)  Ht 5\' 6"  (1.676 m)  Wt 180 lb 2 oz (81.704 kg)  BMI 29.07 kg/m2  SpO2 97% Physical Exam  VS noted Constitutional: Pt is oriented to person, place, and time. Appears well-developed and well-nourished.  HENT:  Head: Normocephalic and atraumatic.  Right Ear: External ear normal.  Left Ear: External ear normal.  Nose: Nose normal.  Mouth/Throat: Oropharynx is clear and moist.  Eyes: Conjunctivae and EOM are normal. Pupils are equal, round, and reactive to light.  unable to  visualize the left disc, right without swelling Neck: Normal range of motion. Neck supple. No JVD present. No tracheal deviation present.  Cardiovascular: Normal rate, regular rhythm, normal heart sounds and intact distal pulses.   Pulmonary/Chest: Effort normal and breath sounds normal.    Abdominal: Soft. Bowel sounds are normal. There is no tenderness.  Musculoskeletal: Normal range of motion. Exhibits no edema.  Lymphadenopathy:  Has no cervical adenopathy.  Neurological: Pt is alert and oriented to person, place, and time. Pt has normal reflexes.motor/sens/dtr intact Skin: Skin is warm and dry. No rash noted.  Psychiatric:  Has  normal mood and affect. Behavior is normal.         Assessment & Plan:

## 2010-12-12 ENCOUNTER — Encounter: Payer: Self-pay | Admitting: Internal Medicine

## 2015-07-23 ENCOUNTER — Telehealth: Payer: Self-pay | Admitting: Internal Medicine

## 2015-07-23 NOTE — Telephone Encounter (Signed)
Patient has not been seen since 2012.  Can patient still re establish with Dr. Jenny Reichmann?

## 2015-07-24 NOTE — Telephone Encounter (Signed)
Ok with me 

## 2015-07-24 NOTE — Telephone Encounter (Signed)
Left vm to call back to schedule appt.  °

## 2015-07-29 ENCOUNTER — Other Ambulatory Visit (INDEPENDENT_AMBULATORY_CARE_PROVIDER_SITE_OTHER): Payer: BLUE CROSS/BLUE SHIELD

## 2015-07-29 ENCOUNTER — Ambulatory Visit (INDEPENDENT_AMBULATORY_CARE_PROVIDER_SITE_OTHER): Payer: BLUE CROSS/BLUE SHIELD | Admitting: Internal Medicine

## 2015-07-29 ENCOUNTER — Encounter: Payer: Self-pay | Admitting: Internal Medicine

## 2015-07-29 VITALS — BP 126/80 | HR 92 | Temp 98.6°F | Resp 20 | Ht 66.0 in | Wt 184.0 lb

## 2015-07-29 DIAGNOSIS — E785 Hyperlipidemia, unspecified: Secondary | ICD-10-CM | POA: Insufficient documentation

## 2015-07-29 DIAGNOSIS — R7989 Other specified abnormal findings of blood chemistry: Secondary | ICD-10-CM | POA: Diagnosis not present

## 2015-07-29 DIAGNOSIS — Z0000 Encounter for general adult medical examination without abnormal findings: Secondary | ICD-10-CM

## 2015-07-29 DIAGNOSIS — D171 Benign lipomatous neoplasm of skin and subcutaneous tissue of trunk: Secondary | ICD-10-CM | POA: Diagnosis not present

## 2015-07-29 LAB — CBC WITH DIFFERENTIAL/PLATELET
Basophils Absolute: 0 10*3/uL (ref 0.0–0.1)
Basophils Relative: 0.4 % (ref 0.0–3.0)
Eosinophils Absolute: 0.1 10*3/uL (ref 0.0–0.7)
Eosinophils Relative: 1.1 % (ref 0.0–5.0)
HCT: 48.5 % (ref 39.0–52.0)
Hemoglobin: 16.6 g/dL (ref 13.0–17.0)
Lymphocytes Relative: 30.7 % (ref 12.0–46.0)
Lymphs Abs: 2.6 10*3/uL (ref 0.7–4.0)
MCHC: 34.2 g/dL (ref 30.0–36.0)
MCV: 90.9 fl (ref 78.0–100.0)
Monocytes Absolute: 0.8 10*3/uL (ref 0.1–1.0)
Monocytes Relative: 9.8 % (ref 3.0–12.0)
Neutro Abs: 4.9 10*3/uL (ref 1.4–7.7)
Neutrophils Relative %: 58 % (ref 43.0–77.0)
Platelets: 216 10*3/uL (ref 150.0–400.0)
RBC: 5.34 Mil/uL (ref 4.22–5.81)
RDW: 12.7 % (ref 11.5–15.5)
WBC: 8.4 10*3/uL (ref 4.0–10.5)

## 2015-07-29 LAB — URINALYSIS, ROUTINE W REFLEX MICROSCOPIC
Bilirubin Urine: NEGATIVE
Hgb urine dipstick: NEGATIVE
Ketones, ur: NEGATIVE
Leukocytes, UA: NEGATIVE
Nitrite: NEGATIVE
RBC / HPF: NONE SEEN (ref 0–?)
Specific Gravity, Urine: 1.015 (ref 1.000–1.030)
Total Protein, Urine: NEGATIVE
Urine Glucose: NEGATIVE
Urobilinogen, UA: 0.2 (ref 0.0–1.0)
pH: 7 (ref 5.0–8.0)

## 2015-07-29 NOTE — Progress Notes (Signed)
Subjective:    Patient ID: Joel Price, male    DOB: 04-Nov-1965, 50 y.o.   MRN: [id number]  HPI  Here for wellness and f/u;  Overall doing ok;  Pt denies Chest pain, worsening SOB, DOE, wheezing, orthopnea, PND, worsening LE edema, palpitations, dizziness or syncope.  Pt denies neurological change such as new headache, facial or extremity weakness.  Pt denies polydipsia, polyuria, or low sugar symptoms. Pt states overall good compliance with treatment and medications, good tolerability, and has been trying to follow appropriate diet.  Pt denies worsening depressive symptoms, suicidal ideation or panic. No fever, night sweats, wt loss, loss of appetite, or other constitutional symptoms.  Pt states good ability with ADL's, has low fall risk, home safety reviewed and adequate, no other significant changes in hearing or vision, and only occasionally active with exercise.  Declines flu shot. Does have a soft lump enlarged over the past yr , wondering what it is. Past Medical History  Diagnosis Date  . BACK PAIN 03/28/2008  . 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 SCROTAL MASS 03/28/2008  . Blurred vision, left eye 12/06/2010   Past Surgical History  Procedure Laterality Date  . Inguinal herniorrhapy    . Appendectomy    . S/p tubes in ears as child      reports that he has been smoking.  He does not have any smokeless tobacco history on file. He reports that he drinks alcohol. His drug history is not on file. family history includes Cancer in his other; Hypertension in his mother. No Known Allergies No current outpatient prescriptions on file prior to visit.   No current facility-administered medications on file prior to visit.    Review of Systems Constitutional: Negative for increased diaphoresis, other activity, appetite or siginficant weight change other than noted HENT: Negative for worsening hearing loss, ear pain, facial swelling, mouth sores and neck stiffness.   Eyes: Negative for  other worsening pain, redness or visual disturbance.  Respiratory: Negative for shortness of breath and wheezing  Cardiovascular: Negative for chest pain and palpitations.  Gastrointestinal: Negative for diarrhea, blood in stool, abdominal distention or other pain Genitourinary: Negative for hematuria, flank pain or change in urine volume.  Musculoskeletal: Negative for myalgias or other joint complaints.  Skin: Negative for color change and wound or drainage.  Neurological: Negative for syncope and numbness. other than noted Hematological: Negative for adenopathy. or other swelling Psychiatric/Behavioral: Negative for hallucinations, SI, self-injury, decreased concentration or other worsening agitation.      Objective:   Physical Exam BP 126/80 mmHg  Pulse 92  Temp(Src) 98.6 F (37 C) (Oral)  Resp 20  Ht 5\' 6"  (1.676 m)  Wt 184 lb (83.462 kg)  BMI 29.71 kg/m2  SpO2 96% VS noted,  Constitutional: Pt is oriented to person, place, and time. Appears well-developed and well-nourished, in no significant distress Head: Normocephalic and atraumatic.  Right Ear: External ear normal.  Left Ear: External ear normal.  Nose: Nose normal.  Mouth/Throat: Oropharynx is clear and moist.  Eyes: Conjunctivae and EOM are normal. Pupils are equal, round, and reactive to light.  Neck: Normal range of motion. Neck supple. No JVD present. No tracheal deviation present or significant neck LA or mass Cardiovascular: Normal rate, regular rhythm, normal heart sounds and intact distal pulses.   Pulmonary/Chest: Effort normal and breath sounds without rales or wheezing  Abdominal: Soft. Bowel sounds are normal. NT. 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 Exhibits no edema.  Lymphadenopathy:  Has no cervical adenopathy.  Neurological: Pt is alert and oriented to person, place, and time. Pt has  normal reflexes. No cranial nerve deficit. Motor grossly intact Skin: Skin is warm and dry. No rash noted.  Psychiatric:  Has normal mood and affect. Behavior is normal.   ECG reviewed as per emr    Assessment & Plan:

## 2015-07-29 NOTE — Progress Notes (Signed)
Pre visit review using our clinic review tool, if applicable. No additional management support is needed unless otherwise documented below in the visit note. 

## 2015-07-29 NOTE — Patient Instructions (Addendum)
Your EKG was OK today  Please continue all other medications as before, and refills have been done if requested.  Please have the pharmacy call with any other refills you may need.  Please continue your efforts at being more active, low cholesterol diet, and weight control.  You are otherwise up to date with prevention measures today.  Please keep your appointments with your specialists as you may have planned  Please go to the LAB in the Basement (turn left off the elevator) for the tests to be done today  You will be contacted by phone if any changes need to be made immediately.  Otherwise, you will receive a letter about your results with an explanation, but please check with MyChart first.  Please remember to sign up for MyChart if you have not done so, as this will be important to you in the future with finding out test results, communicating by private email, and scheduling acute appointments online when needed.  Please return in 1 year for your yearly visit, or sooner if needed, with Lab testing done 3-5 days before  

## 2015-07-30 ENCOUNTER — Encounter: Payer: Self-pay | Admitting: Internal Medicine

## 2015-07-30 LAB — HEPATIC FUNCTION PANEL
ALT: 43 U/L (ref 0–53)
AST: 32 U/L (ref 0–37)
Albumin: 4.5 g/dL (ref 3.5–5.2)
Alkaline Phosphatase: 66 U/L (ref 39–117)
Bilirubin, Direct: 0.1 mg/dL (ref 0.0–0.3)
Total Bilirubin: 0.5 mg/dL (ref 0.2–1.2)
Total Protein: 7.2 g/dL (ref 6.0–8.3)

## 2015-07-30 LAB — BASIC METABOLIC PANEL
BUN: 13 mg/dL (ref 6–23)
CO2: 30 mEq/L (ref 19–32)
Calcium: 9.9 mg/dL (ref 8.4–10.5)
Chloride: 104 mEq/L (ref 96–112)
Creatinine, Ser: 1.03 mg/dL (ref 0.40–1.50)
GFR: 81.34 mL/min (ref >60.00)
Glucose, Bld: 98 mg/dL (ref 70–99)
Potassium: 3.7 mEq/L (ref 3.5–5.1)
Sodium: 140 mEq/L (ref 135–145)

## 2015-07-30 LAB — LIPID PANEL
Cholesterol: 184 mg/dL (ref 0–200)
HDL: 24.3 mg/dL — ABNORMAL LOW (ref >39.00)
NonHDL: 160.1
Total CHOL/HDL Ratio: 8
Triglycerides: 280 mg/dL — ABNORMAL HIGH (ref 0.0–149.0)
VLDL: 56 mg/dL — ABNORMAL HIGH (ref 0.0–40.0)

## 2015-07-30 LAB — TSH: TSH: 0.87 u[IU]/mL (ref 0.35–4.50)

## 2015-07-30 LAB — PSA: PSA: 0.39 ng/mL (ref 0.10–4.00)

## 2015-07-30 LAB — LDL CHOLESTEROL, DIRECT: Direct LDL: 105 mg/dL

## 2015-07-30 NOTE — Assessment & Plan Note (Signed)

## 2015-08-02 DIAGNOSIS — D171 Benign lipomatous neoplasm of skin and subcutaneous tissue of trunk: Secondary | ICD-10-CM | POA: Insufficient documentation

## 2015-08-02 NOTE — Assessment & Plan Note (Signed)
D/w pt, exam c/w lipoma, ok to observe for now, consider referral gen surgury if enlarging

## 2015-08-02 NOTE — Addendum Note (Signed)
Addended by: Biagio Borg on: 08/02/2015 01:34 PM   Modules accepted: Level of Service

## 2015-08-02 NOTE — Assessment & Plan Note (Signed)
Declines statin for now, for lower chol diet Lab Results  Component Value Date   LDLCALC 121* 12/06/2010

## 2017-12-21 ENCOUNTER — Encounter: Payer: Self-pay | Admitting: Internal Medicine

## 2017-12-25 ENCOUNTER — Ambulatory Visit (INDEPENDENT_AMBULATORY_CARE_PROVIDER_SITE_OTHER): Payer: BLUE CROSS/BLUE SHIELD | Admitting: Internal Medicine

## 2017-12-25 ENCOUNTER — Encounter: Payer: Self-pay | Admitting: Internal Medicine

## 2017-12-25 ENCOUNTER — Other Ambulatory Visit (INDEPENDENT_AMBULATORY_CARE_PROVIDER_SITE_OTHER): Payer: BLUE CROSS/BLUE SHIELD

## 2017-12-25 VITALS — BP 106/78 | HR 83 | Temp 98.6°F | Ht 66.0 in | Wt 192.0 lb

## 2017-12-25 DIAGNOSIS — R6882 Decreased libido: Secondary | ICD-10-CM

## 2017-12-25 DIAGNOSIS — Z114 Encounter for screening for human immunodeficiency virus [HIV]: Secondary | ICD-10-CM

## 2017-12-25 DIAGNOSIS — Z0000 Encounter for general adult medical examination without abnormal findings: Secondary | ICD-10-CM

## 2017-12-25 LAB — CBC WITH DIFFERENTIAL/PLATELET
Basophils Absolute: 0 10*3/uL (ref 0.0–0.1)
Basophils Relative: 0.6 % (ref 0.0–3.0)
Eosinophils Absolute: 0.1 10*3/uL (ref 0.0–0.7)
Eosinophils Relative: 1.3 % (ref 0.0–5.0)
HCT: 43.6 % (ref 39.0–52.0)
Hemoglobin: 15.6 g/dL (ref 13.0–17.0)
Lymphocytes Relative: 36 % (ref 12.0–46.0)
Lymphs Abs: 2.6 10*3/uL (ref 0.7–4.0)
MCHC: 35.8 g/dL (ref 30.0–36.0)
MCV: 90 fl (ref 78.0–100.0)
Monocytes Absolute: 0.7 10*3/uL (ref 0.1–1.0)
Monocytes Relative: 10.2 % (ref 3.0–12.0)
Neutro Abs: 3.8 10*3/uL (ref 1.4–7.7)
Neutrophils Relative %: 51.9 % (ref 43.0–77.0)
Platelets: 189 10*3/uL (ref 150.0–400.0)
RBC: 4.84 Mil/uL (ref 4.22–5.81)
RDW: 12.6 % (ref 11.5–15.5)
WBC: 7.3 10*3/uL (ref 4.0–10.5)

## 2017-12-25 LAB — HEPATIC FUNCTION PANEL
ALT: 42 U/L (ref 0–53)
AST: 29 U/L (ref 0–37)
Albumin: 4.3 g/dL (ref 3.5–5.2)
Alkaline Phosphatase: 57 U/L (ref 39–117)
Bilirubin, Direct: 0.1 mg/dL (ref 0.0–0.3)
Total Bilirubin: 0.5 mg/dL (ref 0.2–1.2)
Total Protein: 7.2 g/dL (ref 6.0–8.3)

## 2017-12-25 LAB — URINALYSIS, ROUTINE W REFLEX MICROSCOPIC
Bilirubin Urine: NEGATIVE
Hgb urine dipstick: NEGATIVE
Ketones, ur: NEGATIVE
Leukocytes, UA: NEGATIVE
Nitrite: NEGATIVE
RBC / HPF: NONE SEEN (ref 0–?)
Specific Gravity, Urine: 1.025 (ref 1.000–1.030)
Urine Glucose: NEGATIVE
Urobilinogen, UA: 0.2 (ref 0.0–1.0)
pH: 6 (ref 5.0–8.0)

## 2017-12-25 LAB — LIPID PANEL
Cholesterol: 162 mg/dL (ref 0–200)
HDL: 23.5 mg/dL — ABNORMAL LOW (ref >39.00)
NonHDL: 138.18
Total CHOL/HDL Ratio: 7
Triglycerides: 277 mg/dL — ABNORMAL HIGH (ref 0.0–149.0)
VLDL: 55.4 mg/dL — ABNORMAL HIGH (ref 0.0–40.0)

## 2017-12-25 LAB — BASIC METABOLIC PANEL
BUN: 15 mg/dL (ref 6–23)
CO2: 29 mEq/L (ref 19–32)
Calcium: 9.3 mg/dL (ref 8.4–10.5)
Chloride: 101 mEq/L (ref 96–112)
Creatinine, Ser: 1.23 mg/dL (ref 0.40–1.50)
GFR: 65.65 mL/min (ref >60.00)
Glucose, Bld: 99 mg/dL (ref 70–99)
Potassium: 3.7 mEq/L (ref 3.5–5.1)
Sodium: 139 mEq/L (ref 135–145)

## 2017-12-25 LAB — TESTOSTERONE: Testosterone: 291.65 ng/dL — ABNORMAL LOW (ref 300.00–890.00)

## 2017-12-25 LAB — TSH: TSH: 0.67 u[IU]/mL (ref 0.35–4.50)

## 2017-12-25 LAB — LDL CHOLESTEROL, DIRECT: Direct LDL: 99 mg/dL

## 2017-12-25 LAB — PSA: PSA: 0.36 ng/mL (ref 0.10–4.00)

## 2017-12-25 NOTE — Assessment & Plan Note (Signed)
Also for testost level

## 2017-12-25 NOTE — Progress Notes (Signed)
Subjective:    Patient ID: Joel Price, male    DOB: 1965-11-03, 52 y.o.   MRN: [id number]  HPI  Here for wellness and f/u;  Overall doing ok;  Pt denies Chest pain, worsening SOB, DOE, wheezing, orthopnea, PND, worsening LE edema, palpitations, dizziness or syncope.  Pt denies neurological change such as new headache, facial or extremity weakness.  Pt denies polydipsia, polyuria, or low sugar symptoms. Pt states overall good compliance with treatment and medications, good tolerability, and has been trying to follow appropriate diet.  Pt denies worsening depressive symptoms, suicidal ideation or panic. No fever, night sweats, wt loss, loss of appetite, or other constitutional symptoms.  Pt states good ability with ADL's, has low fall risk, home safety reviewed and adequate, no other significant changes in hearing or vision, and only occasionally active with exercise.  Quit smoking x 6 mo, doing Juul to assist. Breathing "one hundred times better);  Unfortunately gained wt . Wt Readings from Last 3 Encounters:  12/25/17 192 lb (87.1 kg)  07/29/15 184 lb (83.5 kg)  12/06/10 180 lb 2 oz (81.7 kg)   Past Medical History:  Diagnosis Date  . BACK PAIN 03/28/2008  . Blurred vision, left eye 12/06/2010  . 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 SCROTAL MASS 03/28/2008   Past Surgical History:  Procedure Laterality Date  . APPENDECTOMY    . inguinal herniorrhapy    . s/p tubes in ears as child      reports that he has been smoking.  He has never used smokeless tobacco. He reports that he drinks alcohol. His drug history is not on file. family history includes Cancer in his other; Hypertension in his mother. No Known Allergies No current outpatient medications on file prior to visit.   No current facility-administered medications on file prior to visit.    Review of Systems Constitutional: Negative for other unusual diaphoresis, sweats, appetite or weight changes HENT: Negative for other  worsening hearing loss, ear pain, facial swelling, mouth sores or neck stiffness.   Eyes: Negative for other worsening pain, redness or other visual disturbance.  Respiratory: Negative for other stridor or swelling Cardiovascular: Negative for other palpitations or other chest pain  Gastrointestinal: Negative for worsening diarrhea or loose stools, blood in stool, distention or other pain Genitourinary: Negative for hematuria, flank pain or other change in urine volume.  Musculoskeletal: Negative for myalgias or other joint swelling.  Skin: Negative for other color change, or other wound or worsening drainage.  Neurological: Negative for other syncope or numbness. Hematological: Negative for other adenopathy or swelling Psychiatric/Behavioral: Negative for hallucinations, other worsening agitation, SI, self-injury, or new decreased concentration All other system neg per pt    Objective:   Physical Exam BP 106/78   Pulse 83   Temp 98.6 F (37 C) (Oral)   Ht 5\' 6"  (1.676 m)   Wt 192 lb (87.1 kg)   SpO2 96%   BMI 30.99 kg/m  VS noted, obese Constitutional: Pt is oriented to person, place, and time. Appears well-developed and well-nourished, in no significant distress and comfortable Head: Normocephalic and atraumatic  Eyes: Conjunctivae and EOM are normal. Pupils are equal, round, and reactive to light Right Ear: External ear normal without discharge Left Ear: External ear normal without discharge Nose: Nose without discharge or deformity Mouth/Throat: Oropharynx is without other ulcerations and moist  Neck: Normal range of motion. Neck supple. No JVD present. No tracheal deviation present or significant neck LA or mass  Cardiovascular: Normal rate, regular rhythm, normal heart sounds and intact distal pulses.   Pulmonary/Chest: WOB normal and breath sounds without rales or wheezing  Abdominal: Soft. Bowel sounds are normal. NT. No HSM  Musculoskeletal: Normal range of motion.  Exhibits no edema Lymphadenopathy: Has no other cervical adenopathy.  Neurological: Pt is alert and oriented to person, place, and time. Pt has normal reflexes. No cranial nerve deficit. Motor grossly intact, Gait intact Skin: Skin is warm and dry. No rash noted or new ulcerations Psychiatric:  Has normal mood and affect. Behavior is normal without agitation No other eam findings Lab Results  Component Value Date   WBC 8.4 07/29/2015   HGB 16.6 07/29/2015   HCT 48.5 07/29/2015   PLT 216.0 07/29/2015   GLUCOSE 98 07/29/2015   CHOL 184 07/29/2015   TRIG 280.0 (H) 07/29/2015   HDL 24.30 (L) 07/29/2015   LDLDIRECT 105.0 07/29/2015   LDLCALC 121 (H) 12/06/2010   ALT 43 07/29/2015   AST 32 07/29/2015   NA 140 07/29/2015   K 3.7 07/29/2015   CL 104 07/29/2015   CREATININE 1.03 07/29/2015   BUN 13 07/29/2015   CO2 30 07/29/2015   TSH 0.87 07/29/2015   PSA 0.39 07/29/2015      Assessment & Plan:

## 2017-12-25 NOTE — Assessment & Plan Note (Signed)

## 2017-12-25 NOTE — Patient Instructions (Signed)

## 2017-12-26 LAB — HIV ANTIBODY (ROUTINE TESTING W REFLEX): HIV 1&2 Ab, 4th Generation: NONREACTIVE

## 2021-08-10 ENCOUNTER — Other Ambulatory Visit: Payer: Self-pay

## 2021-08-10 ENCOUNTER — Ambulatory Visit (INDEPENDENT_AMBULATORY_CARE_PROVIDER_SITE_OTHER): Payer: 59 | Admitting: Internal Medicine

## 2021-08-10 ENCOUNTER — Encounter: Payer: Self-pay | Admitting: Internal Medicine

## 2021-08-10 VITALS — BP 122/70 | HR 79 | Temp 99.1°F | Ht 66.0 in | Wt 198.0 lb

## 2021-08-10 DIAGNOSIS — E538 Deficiency of other specified B group vitamins: Secondary | ICD-10-CM | POA: Diagnosis not present

## 2021-08-10 DIAGNOSIS — R1013 Epigastric pain: Secondary | ICD-10-CM

## 2021-08-10 DIAGNOSIS — R739 Hyperglycemia, unspecified: Secondary | ICD-10-CM | POA: Diagnosis not present

## 2021-08-10 DIAGNOSIS — E7800 Pure hypercholesterolemia, unspecified: Secondary | ICD-10-CM | POA: Diagnosis not present

## 2021-08-10 DIAGNOSIS — R361 Hematospermia: Secondary | ICD-10-CM

## 2021-08-10 DIAGNOSIS — Z1159 Encounter for screening for other viral diseases: Secondary | ICD-10-CM | POA: Diagnosis not present

## 2021-08-10 DIAGNOSIS — Z1211 Encounter for screening for malignant neoplasm of colon: Secondary | ICD-10-CM

## 2021-08-10 DIAGNOSIS — E559 Vitamin D deficiency, unspecified: Secondary | ICD-10-CM | POA: Diagnosis not present

## 2021-08-10 DIAGNOSIS — R109 Unspecified abdominal pain: Secondary | ICD-10-CM | POA: Insufficient documentation

## 2021-08-10 DIAGNOSIS — R6882 Decreased libido: Secondary | ICD-10-CM

## 2021-08-10 DIAGNOSIS — Z0001 Encounter for general adult medical examination with abnormal findings: Secondary | ICD-10-CM | POA: Diagnosis not present

## 2021-08-10 LAB — BASIC METABOLIC PANEL
BUN: 14 mg/dL (ref 6–23)
CO2: 31 mEq/L (ref 19–32)
Calcium: 9.9 mg/dL (ref 8.4–10.5)
Chloride: 100 mEq/L (ref 96–112)
Creatinine, Ser: 1.04 mg/dL (ref 0.40–1.50)
GFR: 80.7 mL/min (ref >60.00)
Glucose, Bld: 96 mg/dL (ref 70–99)
Potassium: 4 mEq/L (ref 3.5–5.1)
Sodium: 138 mEq/L (ref 135–145)

## 2021-08-10 LAB — CBC WITH DIFFERENTIAL/PLATELET
Basophils Absolute: 0 10*3/uL (ref 0.0–0.1)
Basophils Relative: 0.6 % (ref 0.0–3.0)
Eosinophils Absolute: 0.1 10*3/uL (ref 0.0–0.7)
Eosinophils Relative: 1.8 % (ref 0.0–5.0)
HCT: 43.9 % (ref 39.0–52.0)
Hemoglobin: 15.3 g/dL (ref 13.0–17.0)
Lymphocytes Relative: 29.9 % (ref 12.0–46.0)
Lymphs Abs: 2.3 10*3/uL (ref 0.7–4.0)
MCHC: 34.8 g/dL (ref 30.0–36.0)
MCV: 90.5 fl (ref 78.0–100.0)
Monocytes Absolute: 0.6 10*3/uL (ref 0.1–1.0)
Monocytes Relative: 8.3 % (ref 3.0–12.0)
Neutro Abs: 4.5 10*3/uL (ref 1.4–7.7)
Neutrophils Relative %: 59.4 % (ref 43.0–77.0)
Platelets: 206 10*3/uL (ref 150.0–400.0)
RBC: 4.85 Mil/uL (ref 4.22–5.81)
RDW: 13.4 % (ref 11.5–15.5)
WBC: 7.6 10*3/uL (ref 4.0–10.5)

## 2021-08-10 LAB — URINALYSIS, ROUTINE W REFLEX MICROSCOPIC
Bilirubin Urine: NEGATIVE
Hgb urine dipstick: NEGATIVE
Ketones, ur: NEGATIVE
Leukocytes,Ua: NEGATIVE
Nitrite: NEGATIVE
RBC / HPF: NONE SEEN (ref 0–?)
Specific Gravity, Urine: 1.025 (ref 1.000–1.030)
Total Protein, Urine: NEGATIVE
Urine Glucose: NEGATIVE
Urobilinogen, UA: 0.2 (ref 0.0–1.0)
WBC, UA: NONE SEEN (ref 0–?)
pH: 6 (ref 5.0–8.0)

## 2021-08-10 LAB — HEPATIC FUNCTION PANEL
ALT: 40 U/L (ref 0–53)
AST: 28 U/L (ref 0–37)
Albumin: 4.7 g/dL (ref 3.5–5.2)
Alkaline Phosphatase: 66 U/L (ref 39–117)
Bilirubin, Direct: 0.1 mg/dL (ref 0.0–0.3)
Total Bilirubin: 0.7 mg/dL (ref 0.2–1.2)
Total Protein: 8 g/dL (ref 6.0–8.3)

## 2021-08-10 LAB — TSH: TSH: 1.35 u[IU]/mL (ref 0.35–5.50)

## 2021-08-10 LAB — HEMOGLOBIN A1C: Hgb A1c MFr Bld: 5.4 % (ref 4.6–6.5)

## 2021-08-10 LAB — LIPID PANEL
Cholesterol: 213 mg/dL — ABNORMAL HIGH (ref 0–200)
HDL: 27.5 mg/dL — ABNORMAL LOW (ref >39.00)
LDL Cholesterol: 150 mg/dL — ABNORMAL HIGH (ref 0–99)
NonHDL: 185.86
Total CHOL/HDL Ratio: 8
Triglycerides: 177 mg/dL — ABNORMAL HIGH (ref 0.0–149.0)
VLDL: 35.4 mg/dL (ref 0.0–40.0)

## 2021-08-10 LAB — PSA: PSA: 0.24 ng/mL (ref 0.10–4.00)

## 2021-08-10 LAB — VITAMIN D 25 HYDROXY (VIT D DEFICIENCY, FRACTURES): VITD: 16.64 ng/mL — ABNORMAL LOW (ref 30.00–100.00)

## 2021-08-10 LAB — VITAMIN B12: Vitamin B-12: 576 pg/mL (ref 211–911)

## 2021-08-10 MED ORDER — PANTOPRAZOLE SODIUM 40 MG PO TBEC: 40.0000 mg | DELAYED_RELEASE_TABLET | Freq: Every day | ORAL | 3 refills | Status: DC

## 2021-08-10 MED ORDER — SUCRALFATE 1 G PO TABS: 1.0000 g | ORAL_TABLET | Freq: Three times a day (TID) | ORAL | 0 refills | Status: DC

## 2021-08-10 NOTE — Progress Notes (Signed)
Patient ID: Joel Price, male   DOB: Oct 18, 1965, 56 y.o.   MRN: [id number]         Chief Complaint:: wellness exam and Office Visit (Upper abdominal soreness)         HPI:  Joel Price is a 56 y.o. male here for wellness exam; due for colonoscopy, declines shingirx and tdap, o/w up to date                        Also c/o 2 wks onset gradually worsening epigastric pain without radiation, but with nausea, no vomiting and Denies worsening reflux, dysphagia, bowel change or blood.  Pt denies fever, wt loss, night sweats, loss of appetite, or other constitutional symptoms  Quit smoking x 3 yrs.   ETOH - 2 beers per wk.  Only rare nsaid use.  Some soreness to touch.  For some reason pain seems worse with eating oatmeal, nothing else makes better or worse.  Also incidentally mentions he has seen blood in ejaculate once last wk, but Denies urinary symptoms such as dysuria, frequency, urgency, flank pain, hematuria or n/v, fever, chills.  Has noticed some decreased libido recently gradually over the years, wondering if really normal.  Denies worsening depressive symptoms, suicidal ideation, or panic; Wt Readings from Last 3 Encounters:  08/10/21 198 lb (89.8 kg)  12/25/17 192 lb (87.1 kg)  07/29/15 184 lb (83.5 kg)   BP Readings from Last 3 Encounters:  08/10/21 122/70  12/25/17 106/78  07/29/15 126/80   Immunization History  Administered Date(s) Administered   Tdap 12/06/2010   Health Maintenance Due  Topic Date Due   COLONOSCOPY (Pts 45-5yrs Insurance coverage will need to be confirmed)  Never done      Past Medical History:  Diagnosis Date   BACK PAIN 03/28/2008   Blurred vision, left eye 12/06/2010   NEPHROLITHIASIS, 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 His smoking use included cigarettes. He has  never used smokeless tobacco. He reports current alcohol use. No history on file for drug use. family history includes Cancer in an other family member; Hypertension in his mother. No Known Allergies No current outpatient medications on file prior to visit.   No current facility-administered medications on file prior to visit.        ROS:  All others reviewed and negative.  Objective        PE:  BP 122/70 (BP Location: Right Arm, Patient Position: Sitting, Cuff Size: Large)    Pulse 79    Temp 99.1 F (37.3 C) (Oral)    Ht 5\' 6"  (1.676 m)    Wt 198 lb (89.8 kg)    SpO2 97%    BMI 31.96 kg/m                 Constitutional: Pt appears in NAD               HENT: Head: NCAT.                Right Ear: External ear normal.                 Left Ear: External ear normal.  Eyes: . Pupils are equal, round, and reactive to light. Conjunctivae and EOM are normal               Nose: without d/c or deformity               Neck: Neck supple. Gross normal ROM               Cardiovascular: Normal rate and regular rhythm.                 Pulmonary/Chest: Effort normal and breath sounds without rales or wheezing.                Abd:  Soft, mild epigastric tender, ND, + BS, no organomegaly               Neurological: Pt is alert. At baseline orientation, motor grossly intact               Skin: Skin is warm. No rashes, no other new lesions, LE edema - none               Psychiatric: Pt behavior is normal without agitation   Micro: none  Cardiac tracings I have personally interpreted today:  none  Pertinent Radiological findings (summarize): none   Lab Results  Component Value Date   WBC 7.6 08/10/2021   HGB 15.3 08/10/2021   HCT 43.9 08/10/2021   PLT 206.0 08/10/2021   GLUCOSE 96 08/10/2021   CHOL 213 (H) 08/10/2021   TRIG 177.0 (H) 08/10/2021   HDL 27.50 (L) 08/10/2021   LDLDIRECT 99.0 12/25/2017   LDLCALC 150 (H) 08/10/2021   ALT 40 08/10/2021   AST 28 08/10/2021   NA 138  08/10/2021   K 4.0 08/10/2021   CL 100 08/10/2021   CREATININE 1.04 08/10/2021   BUN 14 08/10/2021   CO2 31 08/10/2021   TSH 1.35 08/10/2021   PSA 0.24 08/10/2021   HGBA1C 5.4 08/10/2021   Assessment/Plan:  Joel Price is a 56 y.o. White or Caucasian [1] male with  has a past medical history of BACK PAIN (03/28/2008), Blurred vision, left eye (12/06/2010), NEPHROLITHIASIS, HX OF (03/28/2008), and SCROTAL MASS (03/28/2008).  Encounter for well adult exam with abnormal findings Age and sex appropriate education and counseling updated with regular exercise and diet Referrals for preventative services - none needed Immunizations addressed - shinrgix, tdap Smoking counseling  - none needed Evidence for depression or other mood disorder - none significant Most recent labs reviewed. I have personally reviewed and have noted: 1) the patient's medical and social history 2) The patient's current medications and supplements 3) The patient's height, weight, and BMI have been recorded in the chart   Abdominal pain C/w gastritis etiology unclear, for protonix 40 qd and carafate asd,  to f/u any worsening symptoms or concerns  Hyperlipidemia Lab Results  Component Value Date   LDLCALC 150 (H) 08/10/2021   Stable, pt to continue current low chol diet, declines statin for now   Vitamin D deficiency Last vitamin D Lab Results  Component Value Date   VD25OH 16.64 (L) 08/10/2021   Low, to start oral replacement   Hyperglycemia Lab Results  Component Value Date   HGBA1C 5.4 08/10/2021   Stable, pt to continue current medical treatment  - diet   Bloody ejaculation D/w pt, benign condition, continue to monitor  Low libido Has been gradually worsening recent - declines psych aspect for now, cont to follow  Followup: Return  in about 1 year (around 08/10/2022).  Cathlean Cower, MD 08/15/2021 2:11 PM [facility]

## 2021-08-10 NOTE — Patient Instructions (Addendum)
Please take all new medication as prescribed - the protonix and the carafate for the stomach  Please call in 1 wk for GI referral if not improving  You will be contacted regarding the referral for: colonoscopy  Please continue all other medications as before, and refills have been done if requested.  Please have the pharmacy call with any other refills you may need.  Please continue your efforts at being more active, low cholesterol diet, and weight control.  You are otherwise up to date with prevention measures today.  Please keep your appointments with your specialists as you may have planned  Please go to the LAB at the blood drawing area for the tests to be done  You will be contacted by phone if any changes need to be made immediately.  Otherwise, you will receive a letter about your results with an explanation, but please check with MyChart first.  Please remember to sign up for MyChart if you have not done so, as this will be important to you in the future with finding out test results, communicating by private email, and scheduling acute appointments online when needed.  Please make an Appointment to return for your 1 year visit, or sooner if needed

## 2021-08-11 DIAGNOSIS — E559 Vitamin D deficiency, unspecified: Secondary | ICD-10-CM | POA: Insufficient documentation

## 2021-08-11 LAB — H. PYLORI ANTIBODY, IGG: H Pylori IgG: NEGATIVE

## 2021-08-11 LAB — HEPATITIS C ANTIBODY
Hepatitis C Ab: NONREACTIVE
SIGNAL TO CUT-OFF: 0.04 (ref <1.00)

## 2021-08-15 ENCOUNTER — Encounter: Payer: Self-pay | Admitting: Internal Medicine

## 2021-08-15 DIAGNOSIS — R361 Hematospermia: Secondary | ICD-10-CM | POA: Insufficient documentation

## 2021-08-15 DIAGNOSIS — R739 Hyperglycemia, unspecified: Secondary | ICD-10-CM | POA: Insufficient documentation

## 2021-08-15 NOTE — Assessment & Plan Note (Signed)
Last vitamin D Lab Results  Component Value Date   VD25OH 16.64 (L) 08/10/2021   Low, to start oral replacement

## 2021-08-15 NOTE — Assessment & Plan Note (Signed)
Age and sex appropriate education and counseling updated with regular exercise and diet Referrals for preventative services - none needed Immunizations addressed - shinrgix, tdap Smoking counseling  - none needed Evidence for depression or other mood disorder - none significant Most recent labs reviewed. I have personally reviewed and have noted: 1) the patient's medical and social history 2) The patient's current medications and supplements 3) The patient's height, weight, and BMI have been recorded in the chart

## 2021-08-15 NOTE — Assessment & Plan Note (Signed)
C/w gastritis etiology unclear, for protonix 40 qd and carafate asd,  to f/u any worsening symptoms or concerns

## 2021-08-15 NOTE — Assessment & Plan Note (Signed)
Has been gradually worsening recent - declines psych aspect for now, cont to follow

## 2021-08-15 NOTE — Assessment & Plan Note (Signed)
Lab Results  Component Value Date   HGBA1C 5.4 08/10/2021   Stable, pt to continue current medical treatment  - diet

## 2021-08-15 NOTE — Assessment & Plan Note (Signed)
D/w pt, benign condition, continue to monitor

## 2021-08-15 NOTE — Assessment & Plan Note (Signed)
Lab Results  Component Value Date   LDLCALC 150 (H) 08/10/2021   Stable, pt to continue current low chol diet, declines statin for now

## 2022-07-12 ENCOUNTER — Encounter: Payer: Self-pay | Admitting: Internal Medicine

## 2022-07-12 ENCOUNTER — Ambulatory Visit: Payer: 59 | Admitting: Internal Medicine

## 2022-07-12 VITALS — BP 120/68 | HR 73 | Temp 98.2°F | Ht 66.0 in | Wt 203.0 lb

## 2022-07-12 DIAGNOSIS — Z0001 Encounter for general adult medical examination with abnormal findings: Secondary | ICD-10-CM | POA: Diagnosis not present

## 2022-07-12 DIAGNOSIS — R739 Hyperglycemia, unspecified: Secondary | ICD-10-CM | POA: Diagnosis not present

## 2022-07-12 DIAGNOSIS — Z23 Encounter for immunization: Secondary | ICD-10-CM | POA: Diagnosis not present

## 2022-07-12 DIAGNOSIS — E7800 Pure hypercholesterolemia, unspecified: Secondary | ICD-10-CM

## 2022-07-12 DIAGNOSIS — M7550 Bursitis of unspecified shoulder: Secondary | ICD-10-CM | POA: Insufficient documentation

## 2022-07-12 DIAGNOSIS — M7551 Bursitis of right shoulder: Secondary | ICD-10-CM | POA: Diagnosis not present

## 2022-07-12 DIAGNOSIS — E538 Deficiency of other specified B group vitamins: Secondary | ICD-10-CM | POA: Diagnosis not present

## 2022-07-12 DIAGNOSIS — Z1211 Encounter for screening for malignant neoplasm of colon: Secondary | ICD-10-CM

## 2022-07-12 DIAGNOSIS — E559 Vitamin D deficiency, unspecified: Secondary | ICD-10-CM

## 2022-07-12 DIAGNOSIS — Z125 Encounter for screening for malignant neoplasm of prostate: Secondary | ICD-10-CM

## 2022-07-12 LAB — CBC WITH DIFFERENTIAL/PLATELET
Basophils Absolute: 0 10*3/uL (ref 0.0–0.1)
Basophils Relative: 0.6 % (ref 0.0–3.0)
Eosinophils Absolute: 0.2 10*3/uL (ref 0.0–0.7)
Eosinophils Relative: 2.6 % (ref 0.0–5.0)
HCT: 45.3 % (ref 39.0–52.0)
Hemoglobin: 15.9 g/dL (ref 13.0–17.0)
Lymphocytes Relative: 33.8 % (ref 12.0–46.0)
Lymphs Abs: 2.3 10*3/uL (ref 0.7–4.0)
MCHC: 35.2 g/dL (ref 30.0–36.0)
MCV: 90 fl (ref 78.0–100.0)
Monocytes Absolute: 0.6 10*3/uL (ref 0.1–1.0)
Monocytes Relative: 8.8 % (ref 3.0–12.0)
Neutro Abs: 3.6 10*3/uL (ref 1.4–7.7)
Neutrophils Relative %: 54.2 % (ref 43.0–77.0)
Platelets: 205 10*3/uL (ref 150.0–400.0)
RBC: 5.03 Mil/uL (ref 4.22–5.81)
RDW: 13.2 % (ref 11.5–15.5)
WBC: 6.7 10*3/uL (ref 4.0–10.5)

## 2022-07-12 LAB — URINALYSIS, ROUTINE W REFLEX MICROSCOPIC
Bilirubin Urine: NEGATIVE
Ketones, ur: NEGATIVE
Leukocytes,Ua: NEGATIVE
Nitrite: NEGATIVE
Specific Gravity, Urine: 1.02 (ref 1.000–1.030)
Total Protein, Urine: NEGATIVE
Urine Glucose: NEGATIVE
Urobilinogen, UA: 0.2 (ref 0.0–1.0)
pH: 6.5 (ref 5.0–8.0)

## 2022-07-12 LAB — HEMOGLOBIN A1C: Hgb A1c MFr Bld: 5.5 % (ref 4.6–6.5)

## 2022-07-12 LAB — HEPATIC FUNCTION PANEL
ALT: 65 U/L — ABNORMAL HIGH (ref 0–53)
AST: 41 U/L — ABNORMAL HIGH (ref 0–37)
Albumin: 4.5 g/dL (ref 3.5–5.2)
Alkaline Phosphatase: 70 U/L (ref 39–117)
Bilirubin, Direct: 0.1 mg/dL (ref 0.0–0.3)
Total Bilirubin: 0.8 mg/dL (ref 0.2–1.2)
Total Protein: 7.7 g/dL (ref 6.0–8.3)

## 2022-07-12 LAB — BASIC METABOLIC PANEL
BUN: 14 mg/dL (ref 6–23)
CO2: 29 mEq/L (ref 19–32)
Calcium: 9.7 mg/dL (ref 8.4–10.5)
Chloride: 101 mEq/L (ref 96–112)
Creatinine, Ser: 0.89 mg/dL (ref 0.40–1.50)
GFR: 95.69 mL/min (ref >60.00)
Glucose, Bld: 97 mg/dL (ref 70–99)
Potassium: 4.1 mEq/L (ref 3.5–5.1)
Sodium: 138 mEq/L (ref 135–145)

## 2022-07-12 LAB — TSH: TSH: 1.31 u[IU]/mL (ref 0.35–5.50)

## 2022-07-12 LAB — LIPID PANEL
Cholesterol: 191 mg/dL (ref 0–200)
HDL: 26.5 mg/dL — ABNORMAL LOW (ref >39.00)
LDL Cholesterol: 129 mg/dL — ABNORMAL HIGH (ref 0–99)
NonHDL: 164.21
Total CHOL/HDL Ratio: 7
Triglycerides: 174 mg/dL — ABNORMAL HIGH (ref 0.0–149.0)
VLDL: 34.8 mg/dL (ref 0.0–40.0)

## 2022-07-12 LAB — VITAMIN D 25 HYDROXY (VIT D DEFICIENCY, FRACTURES): VITD: 13.17 ng/mL — ABNORMAL LOW (ref 30.00–100.00)

## 2022-07-12 LAB — PSA: PSA: 0.34 ng/mL (ref 0.10–4.00)

## 2022-07-12 LAB — VITAMIN B12: Vitamin B-12: 662 pg/mL (ref 211–911)

## 2022-07-12 NOTE — Progress Notes (Signed)
Patient ID: Joel Price, male   DOB: 12-26-1965, 57 y.o.   MRN: [id number]         Chief Complaint:: wellness exam and right shoulder pain, hyperglycemia, hld, low vit d       HPI:  Joel Price is a 57 y.o. male here for wellness exam; due for tdap, will have shingrix at the pharmacy, due for colonoscopy, declines covid vax and flu shot, o/w up to date                    Also has 1 wk onset right shoulder pain and sore to touch at the subacromial area, moderate 5/10, intermittent, worse to raise the arm, seemed to start after increase wt and frequency of wt lifting.  Admits he needs more cardio as well, has gained wt with more sedentary job.  Pt denies chest pain, increased sob or doe, wheezing, orthopnea, PND, increased LE swelling, palpitations, dizziness or syncope.   Pt denies polydipsia, polyuria, or new focal neuro s/s.    Pt denies fever, wt loss, night sweats, loss of appetite, or other constitutional symptoms     Wt Readings from Last 3 Encounters:  07/12/22 203 lb (92.1 kg)  08/10/21 198 lb (89.8 kg)  12/25/17 192 lb (87.1 kg)   BP Readings from Last 3 Encounters:  07/12/22 120/68  08/10/21 122/70  12/25/17 106/78   Immunization History  Administered Date(s) Administered   Tdap 12/06/2010, 07/12/2022   There are no preventive care reminders to display for this patient.     Past Medical History:  Diagnosis Date   BACK PAIN 03/28/2008   Blurred vision, left eye 12/06/2010   NEPHROLITHIASIS, 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 His smoking use included cigarettes. He has never used smokeless tobacco. He reports current alcohol use. No history on file for drug use. family history includes Cancer in an other family member; Hypertension in his mother. No Known Allergies No current  outpatient medications on file prior to visit.   No current facility-administered medications on file prior to visit.        ROS:  All others reviewed and negative.  Objective        PE:  BP 120/68 (BP Location: Right Arm, Patient Position: Sitting, Cuff Size: Large)   Pulse 73   Temp 98.2 F (36.8 C) (Oral)   Ht '5\' 6"'$  (1.676 m)   Wt 203 lb (92.1 kg)   SpO2 95%   BMI 32.77 kg/m                 Constitutional: Pt appears in NAD               HENT: Head: NCAT.                Right Ear: External ear normal.                 Left Ear: External ear normal.                Eyes: . Pupils are equal, round, and reactive to light. Conjunctivae and EOM are normal               Nose: without d/c or  deformity               Neck: Neck supple. Gross normal ROM               Cardiovascular: Normal rate and regular rhythm.                 Pulmonary/Chest: Effort normal and breath sounds without rales or wheezing.                Abd:  Soft, NT, ND, + BS, no organomegaly               Neurological: Pt is alert. At baseline orientation, motor grossly intact               Right shoulder tender subacromial               Skin: Skin is warm. No rashes, no other new lesions, LE edema - none               Psychiatric: Pt behavior is normal without agitation   Micro: none  Cardiac tracings I have personally interpreted today:  none  Pertinent Radiological findings (summarize): none   Lab Results  Component Value Date   WBC 7.6 08/10/2021   HGB 15.3 08/10/2021   HCT 43.9 08/10/2021   PLT 206.0 08/10/2021   GLUCOSE 96 08/10/2021   CHOL 213 (H) 08/10/2021   TRIG 177.0 (H) 08/10/2021   HDL 27.50 (L) 08/10/2021   LDLDIRECT 99.0 12/25/2017   LDLCALC 150 (H) 08/10/2021   ALT 40 08/10/2021   AST 28 08/10/2021   NA 138 08/10/2021   K 4.0 08/10/2021   CL 100 08/10/2021   CREATININE 1.04 08/10/2021   BUN 14 08/10/2021   CO2 31 08/10/2021   TSH 1.35 08/10/2021   PSA 0.24 08/10/2021   HGBA1C 5.4  08/10/2021   Assessment/Plan:  Joel Price is a 57 y.o. White or Caucasian [1] male with  has a past medical history of BACK PAIN (03/28/2008), Blurred vision, left eye (12/06/2010), NEPHROLITHIASIS, HX OF (03/28/2008), and SCROTAL MASS (03/28/2008).  Shoulder bursitis D/w pt, moderate, declines nsaid prn, but will see sports medicine if any worsening, but may be improving now with less overactive wt lifting.  Encounter for well adult exam with abnormal findings Age and sex appropriate education and counseling updated with regular exercise and diet Referrals for preventative services - for colonoscopy Immunizations addressed - for tdap today, declines covid vax and flu shot, for shingrix at pharmacy Smoking counseling  - none needed Evidence for depression or other mood disorder - none significant Most recent labs reviewed. I have personally reviewed and have noted: 1) the patient's medical and social history 2) The patient's current medications and supplements 3) The patient's height, weight, and BMI have been recorded in the chart   Hyperglycemia Lab Results  Component Value Date   HGBA1C 5.4 08/10/2021   Stable, pt to continue current medical treatment  - diet, wt control, for f/u lab  Hyperlipidemia Lab Results  Component Value Date   [place] 150 (H) 08/10/2021   uncontrolled, pt declines statin, for lower chol diet and f/u lab today  Vitamin D deficiency Last vitamin D Lab Results  Component Value Date   VD25OH 16.64 (L) 08/10/2021   Low, reminded to start oral replacement  Followup: Return in about 1 year (around 07/13/2023).  Cathlean Cower, MD 07/12/2022 10:02 AM [facility]

## 2022-07-12 NOTE — Assessment & Plan Note (Signed)
D/w pt, moderate, declines nsaid prn, but will see sports medicine if any worsening, but may be improving now with less overactive wt lifting.

## 2022-07-12 NOTE — Assessment & Plan Note (Signed)
Lab Results  Component Value Date   LDLCALC 150 (H) 08/10/2021   uncontrolled, pt declines statin, for lower chol diet and f/u lab today

## 2022-07-12 NOTE — Assessment & Plan Note (Signed)
Lab Results  Component Value Date   HGBA1C 5.4 08/10/2021   Stable, pt to continue current medical treatment  - diet, wt control, for f/u lab

## 2022-07-12 NOTE — Patient Instructions (Addendum)
You had the Tdap tetanus shot today  Please have your Shingrix (shingles) shots done at your local pharmacy.  Please continue all other medications as before, and refills have been done if requested.  Please have the pharmacy call with any other refills you may need.  Please continue your efforts at being more active, low cholesterol diet, and weight control.  You are otherwise up to date with prevention measures today.  Please keep your appointments with your specialists as you may have planned  Please see the Sports Medicine on the first floor for any worsening right shoulder bursitis  You will be contacted regarding the referral for: colonoscopy  Please go to the LAB at the blood drawing area for the tests to be done  You will be contacted by phone if any changes need to be made immediately.  Otherwise, you will receive a letter about your results with an explanation, but please check with MyChart first.  Please remember to sign up for MyChart if you have not done so, as this will be important to you in the future with finding out test results, communicating by private email, and scheduling acute appointments online when needed.  Please make an Appointment to return for your 1 year visit, or sooner if needed

## 2022-07-12 NOTE — Assessment & Plan Note (Signed)
Age and sex appropriate education and counseling updated with regular exercise and diet Referrals for preventative services - for colonoscopy Immunizations addressed - for tdap today, declines covid vax and flu shot, for shingrix at pharmacy Smoking counseling  - none needed Evidence for depression or other mood disorder - none significant Most recent labs reviewed. I have personally reviewed and have noted: 1) the patient's medical and social history 2) The patient's current medications and supplements 3) The patient's height, weight, and BMI have been recorded in the chart

## 2022-07-12 NOTE — Assessment & Plan Note (Signed)
Last vitamin D Lab Results  Component Value Date   VD25OH 16.64 (L) 08/10/2021   Low, reminded to start oral replacement

## 2022-08-30 ENCOUNTER — Telehealth (INDEPENDENT_AMBULATORY_CARE_PROVIDER_SITE_OTHER): Payer: 59 | Admitting: Internal Medicine

## 2022-08-30 DIAGNOSIS — F17200 Nicotine dependence, unspecified, uncomplicated: Secondary | ICD-10-CM | POA: Diagnosis not present

## 2022-08-30 DIAGNOSIS — E559 Vitamin D deficiency, unspecified: Secondary | ICD-10-CM | POA: Diagnosis not present

## 2022-08-30 DIAGNOSIS — R739 Hyperglycemia, unspecified: Secondary | ICD-10-CM

## 2022-08-30 DIAGNOSIS — J189 Pneumonia, unspecified organism: Secondary | ICD-10-CM

## 2022-08-30 NOTE — Progress Notes (Unsigned)
Patient ID: Joel Price, male   DOB: January 11, 1966, 57 y.o.   MRN: [id number]  Virtual Visit via Video Note  I connected with Joel Price on 08/30/22 at  2:40 PM EDT by a video enabled telemedicine application and verified that I am speaking with the correct person using two identifiers.  Location: Patient: *** Provider: ***   I discussed the limitations of evaluation and management by telemedicine and the availability of in person appointments. The patient expressed understanding and agreed to proceed.  History of Present Illness:    Observations/Objective:   Assessment and Plan:   Follow Up Instructions:    I discussed the assessment and treatment plan with the patient. The patient was provided an opportunity to ask questions and all were answered. The patient agreed with the plan and demonstrated an understanding of the instructions.   The patient was advised to call back or seek an in-person evaluation if the symptoms worsen or if the condition fails to improve as anticipated.   Cathlean Cower, MD

## 2022-08-30 NOTE — Patient Instructions (Signed)
Please continue all other medications as before, and refills have been done if requested.  Please have the pharmacy call with any other refills you may need.  Please keep your appointments with your specialists as you may have planned    

## 2022-09-01 ENCOUNTER — Encounter: Payer: Self-pay | Admitting: Internal Medicine

## 2022-09-01 DIAGNOSIS — F17200 Nicotine dependence, unspecified, uncomplicated: Secondary | ICD-10-CM | POA: Insufficient documentation

## 2022-09-01 DIAGNOSIS — J189 Pneumonia, unspecified organism: Secondary | ICD-10-CM | POA: Insufficient documentation

## 2022-09-01 DIAGNOSIS — Z87891 Personal history of nicotine dependence: Secondary | ICD-10-CM | POA: Insufficient documentation

## 2022-09-01 NOTE — Assessment & Plan Note (Signed)
Last vitamin D Lab Results  Component Value Date   VD25OH 13.17 (L) 07/12/2022   Low, reminded to start oral replacement

## 2022-09-01 NOTE — Assessment & Plan Note (Signed)
Pt counsled to quit, pt has been able to abstain for 5 days, and encouraged to continue same

## 2022-09-01 NOTE — Assessment & Plan Note (Signed)
Lab Results  Component Value Date   HGBA1C 5.5 07/12/2022   Stable, pt to continue current medical treatment  - diet, wt control

## 2022-09-01 NOTE — Assessment & Plan Note (Signed)
With very mild persistent scant prod cough, o/w clincally improved, pt reassured, no other specific treatment needed

## 2022-09-03 NOTE — Addendum Note (Signed)
Addended by: Biagio Borg on: 09/03/2022 05:16 PM   Modules accepted: Level of Service

## 2022-09-08 ENCOUNTER — Ambulatory Visit (INDEPENDENT_AMBULATORY_CARE_PROVIDER_SITE_OTHER): Payer: 59 | Admitting: Internal Medicine

## 2022-09-08 ENCOUNTER — Other Ambulatory Visit: Payer: Self-pay | Admitting: Internal Medicine

## 2022-09-08 ENCOUNTER — Ambulatory Visit (INDEPENDENT_AMBULATORY_CARE_PROVIDER_SITE_OTHER): Payer: 59

## 2022-09-08 VITALS — Temp 98.9°F | Ht 66.0 in | Wt 196.0 lb

## 2022-09-08 DIAGNOSIS — J189 Pneumonia, unspecified organism: Secondary | ICD-10-CM

## 2022-09-08 DIAGNOSIS — F17200 Nicotine dependence, unspecified, uncomplicated: Secondary | ICD-10-CM | POA: Diagnosis not present

## 2022-09-08 DIAGNOSIS — R109 Unspecified abdominal pain: Secondary | ICD-10-CM | POA: Diagnosis not present

## 2022-09-08 DIAGNOSIS — R739 Hyperglycemia, unspecified: Secondary | ICD-10-CM | POA: Diagnosis not present

## 2022-09-08 DIAGNOSIS — R7989 Other specified abnormal findings of blood chemistry: Secondary | ICD-10-CM

## 2022-09-08 LAB — BASIC METABOLIC PANEL
BUN: 12 mg/dL (ref 6–23)
CO2: 29 mEq/L (ref 19–32)
Calcium: 9.7 mg/dL (ref 8.4–10.5)
Chloride: 101 mEq/L (ref 96–112)
Creatinine, Ser: 0.91 mg/dL (ref 0.40–1.50)
GFR: 94.01 mL/min (ref >60.00)
Glucose, Bld: 105 mg/dL — ABNORMAL HIGH (ref 70–99)
Potassium: 3.9 mEq/L (ref 3.5–5.1)
Sodium: 138 mEq/L (ref 135–145)

## 2022-09-08 LAB — URINALYSIS, ROUTINE W REFLEX MICROSCOPIC
Bilirubin Urine: NEGATIVE
Hgb urine dipstick: NEGATIVE
Ketones, ur: NEGATIVE
Leukocytes,Ua: NEGATIVE
Nitrite: NEGATIVE
RBC / HPF: NONE SEEN (ref 0–?)
Specific Gravity, Urine: 1.02 (ref 1.000–1.030)
Total Protein, Urine: NEGATIVE
Urine Glucose: NEGATIVE
Urobilinogen, UA: 0.2 (ref 0.0–1.0)
pH: 6.5 (ref 5.0–8.0)

## 2022-09-08 LAB — CBC WITH DIFFERENTIAL/PLATELET
Basophils Absolute: 0 10*3/uL (ref 0.0–0.1)
Basophils Relative: 0.4 % (ref 0.0–3.0)
Eosinophils Absolute: 0.1 10*3/uL (ref 0.0–0.7)
Eosinophils Relative: 1.5 % (ref 0.0–5.0)
HCT: 44.9 % (ref 39.0–52.0)
Hemoglobin: 15.4 g/dL (ref 13.0–17.0)
Lymphocytes Relative: 28.7 % (ref 12.0–46.0)
Lymphs Abs: 2.2 10*3/uL (ref 0.7–4.0)
MCHC: 34.4 g/dL (ref 30.0–36.0)
MCV: 89.6 fl (ref 78.0–100.0)
Monocytes Absolute: 0.8 10*3/uL (ref 0.1–1.0)
Monocytes Relative: 10.2 % (ref 3.0–12.0)
Neutro Abs: 4.6 10*3/uL (ref 1.4–7.7)
Neutrophils Relative %: 59.2 % (ref 43.0–77.0)
Platelets: 342 10*3/uL (ref 150.0–400.0)
RBC: 5 Mil/uL (ref 4.22–5.81)
RDW: 13 % (ref 11.5–15.5)
WBC: 7.8 10*3/uL (ref 4.0–10.5)

## 2022-09-08 LAB — HEPATIC FUNCTION PANEL
ALT: 99 U/L — ABNORMAL HIGH (ref 0–53)
AST: 43 U/L — ABNORMAL HIGH (ref 0–37)
Albumin: 4.2 g/dL (ref 3.5–5.2)
Alkaline Phosphatase: 121 U/L — ABNORMAL HIGH (ref 39–117)
Bilirubin, Direct: 0.2 mg/dL (ref 0.0–0.3)
Total Bilirubin: 0.5 mg/dL (ref 0.2–1.2)
Total Protein: 8.3 g/dL (ref 6.0–8.3)

## 2022-09-08 NOTE — Patient Instructions (Signed)
Please continue to quit smoking  Please continue all other medications as before  Please have the pharmacy call with any other refills you may need.  Please continue your efforts at being more active, low cholesterol diet, and weight control.  Please keep your appointments with your specialists as you may have planned  Please go to the XRAY Department in the first floor for the x-ray testing  Please go to the LAB at the blood drawing area for the tests to be done  You will be contacted by phone if any changes need to be made immediately.  Otherwise, you will receive a letter about your results with an explanation, but please check with MyChart first.  Please remember to sign up for MyChart if you have not done so, as this will be important to you in the future with finding out test results, communicating by private email, and scheduling acute appointments online when needed.  Please make an Appointment to return in 6 months, or sooner if needed, also with Lab Appointment for testing done 3-5 days before at the [facility] (so this is for TWO appointments - please see the scheduling desk as you leave)

## 2022-09-08 NOTE — Progress Notes (Signed)
Patient ID: Joel Price, male   DOB: 1965/09/18, 57 y.o.   MRN: [id number]        Chief Complaint: follow up smoking, CAP, fatigue, left flank pain      HPI:  Joel Price is a 57 y.o. male here after seen and tx at [facility] with left pleuritic CP 2 wks ago, found to have LLL CAP by xray, tx with doxycycline 100 bid x 10 days, and overall improved without fever, chills, worsening sob, cough, and left pleuritic pain has improved   Pt has some mild intermittent left flank pain as well for unclear reason, hx of renal stones, wondering about recurrent stone activity.  Does c/o ongoing fatigue, but denies signficant daytime hypersomnolence.  Has quit smoking x 2 wks, just no cravings and seems motivated to continue to abstain.         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 His smoking use included cigarettes. He has never used smokeless tobacco. He reports current alcohol use. No history on file for drug use. family history includes Cancer in an other family member; Hypertension in his mother. No Known Allergies No current outpatient medications on file prior to visit.   No current facility-administered medications on file prior to visit.        ROS:  All others reviewed and negative.  Objective        PE:  Temp 98.9 F (37.2 C) (Oral)   Ht 5\' 6"  (1.676 m)   Wt 196 lb (88.9 kg)   SpO2 94%   BMI 31.64 kg/m                 Constitutional: Pt appears in NAD                HENT: Head: NCAT.                Right Ear: External ear normal.                 Left Ear: External ear normal.                Eyes: . Pupils are equal, round, and reactive to light. Conjunctivae and EOM are normal               Nose: without d/c or deformity               Neck: Neck supple. Gross normal ROM               Cardiovascular: Normal rate and regular rhythm.                 Pulmonary/Chest: Effort normal and breath sounds decreased left lower lung field without rales or wheezing.                Abd:  Soft, NT, ND, + BS, no organomegaly               Neurological: Pt is alert. At baseline orientation, motor grossly intact               Skin: Skin is warm. No rashes, no other new lesions, LE edema - none               Psychiatric: Pt behavior is normal without agitation   Micro: none  Cardiac tracings I have personally interpreted today:  none  Pertinent Radiological findings (summarize): none   Lab Results  Component Value Date   WBC 7.8 09/08/2022   HGB 15.4 09/08/2022   HCT 44.9 09/08/2022   PLT 342.0 09/08/2022   GLUCOSE 105 (H) 09/08/2022   CHOL 191 07/12/2022   TRIG 174.0 (H) 07/12/2022   HDL 26.50 (L) 07/12/2022   LDLDIRECT 99.0 12/25/2017   LDLCALC 129 (H) 07/12/2022   ALT 99 (H) 09/08/2022   AST 43 (H) 09/08/2022   NA 138 09/08/2022   K 3.9 09/08/2022   CL 101 09/08/2022   CREATININE 0.91 09/08/2022   BUN 12 09/08/2022   CO2 29 09/08/2022   TSH 1.31 07/12/2022   PSA 0.34 07/12/2022   HGBA1C 5.5 07/12/2022   Assessment/Plan:  Joel Price is a 57 y.o. White or Caucasian [1] male with  has a past medical history of BACK PAIN (03/28/2008), Blurred vision, left eye (12/06/2010), NEPHROLITHIASIS, HX OF (03/28/2008), and SCROTAL MASS (03/28/2008).  CAP (community acquired pneumonia) LLL by hx, for f/u cxr today though has only been 2 wks given persistent but improving pain; fortunately non toxic, afeb with less sob and no cough, will continue to follow  without further antibx for now,  to f/u any worsening symptoms or concerns  may need further cxr or CT chest for persistent radiographic abnormal  Smoker Quit again for 2 wks, this time may be permanent as seems motivated and has already been 2 wks  Left flank pain ? Referred pain related to LLL pneumonia, vs msk or other - for UA to r/o hematuria and possible stone  Hyperglycemia Lab Results  Component Value Date   HGBA1C 5.5 07/12/2022   Stable, pt to continue current medical treatment  - diet, wt control  Followup: Return if symptoms worsen or fail to improve.  Cathlean Cower, MD 09/11/2022 6:53 AM [facility]

## 2022-09-11 ENCOUNTER — Encounter: Payer: Self-pay | Admitting: Internal Medicine

## 2022-09-11 DIAGNOSIS — R109 Unspecified abdominal pain: Secondary | ICD-10-CM | POA: Insufficient documentation

## 2022-09-11 NOTE — Assessment & Plan Note (Signed)
Quit again for 2 wks, this time may be permanent as seems motivated and has already been 2 wks

## 2022-09-11 NOTE — Assessment & Plan Note (Signed)
?   Referred pain related to LLL pneumonia, vs msk or other - for UA to r/o hematuria and possible stone

## 2022-09-11 NOTE — Assessment & Plan Note (Addendum)
LLL by hx, for f/u cxr today though has only been 2 wks given persistent but improving pain; fortunately non toxic, afeb with less sob and no cough, will continue to follow without further antibx for now,  to f/u any worsening symptoms or concerns  may need further cxr or CT chest for persistent radiographic abnormal

## 2022-09-11 NOTE — Assessment & Plan Note (Signed)
Lab Results  Component Value Date   HGBA1C 5.5 07/12/2022   Stable, pt to continue current medical treatment  - diet, wt control 

## 2022-10-10 ENCOUNTER — Ambulatory Visit
Admission: RE | Admit: 2022-10-10 | Discharge: 2022-10-10 | Disposition: A | Discharge status: Home / Self Care | Payer: 59 | Source: Ambulatory Visit | Attending: Internal Medicine | Admitting: Internal Medicine

## 2022-10-10 DIAGNOSIS — R7989 Other specified abnormal findings of blood chemistry: Secondary | ICD-10-CM

## 2022-10-19 ENCOUNTER — Telehealth: Payer: 59 | Admitting: Physician Assistant

## 2022-10-19 DIAGNOSIS — B9689 Other specified bacterial agents as the cause of diseases classified elsewhere: Secondary | ICD-10-CM

## 2022-10-19 DIAGNOSIS — J019 Acute sinusitis, unspecified: Secondary | ICD-10-CM

## 2022-10-19 MED ORDER — BENZONATATE 100 MG PO CAPS: 100.0000 mg | ORAL_CAPSULE | Freq: Three times a day (TID) | ORAL | 0 refills | Status: DC | PRN

## 2022-10-19 MED ORDER — DOXYCYCLINE HYCLATE 100 MG PO TABS: 100.0000 mg | ORAL_TABLET | Freq: Two times a day (BID) | ORAL | 0 refills | Status: DC

## 2022-10-19 NOTE — Progress Notes (Signed)
I have spent 5 minutes in review of e-visit questionnaire, review and updating patient chart, medical decision making and response to patient.   Marelly Wehrman Cody Suede [place], PA-C    

## 2022-10-19 NOTE — Progress Notes (Signed)

## 2022-10-24 ENCOUNTER — Encounter: Payer: Self-pay | Admitting: Gastroenterology

## 2022-10-26 ENCOUNTER — Encounter: Payer: Self-pay | Admitting: Internal Medicine

## 2022-10-26 ENCOUNTER — Telehealth (INDEPENDENT_AMBULATORY_CARE_PROVIDER_SITE_OTHER): Payer: 59 | Admitting: Internal Medicine

## 2022-10-26 DIAGNOSIS — R739 Hyperglycemia, unspecified: Secondary | ICD-10-CM

## 2022-10-26 DIAGNOSIS — R350 Frequency of micturition: Secondary | ICD-10-CM

## 2022-10-26 DIAGNOSIS — E559 Vitamin D deficiency, unspecified: Secondary | ICD-10-CM | POA: Diagnosis not present

## 2022-10-26 DIAGNOSIS — F17200 Nicotine dependence, unspecified, uncomplicated: Secondary | ICD-10-CM

## 2022-10-26 MED ORDER — LEVOFLOXACIN 500 MG PO TABS: 500.0000 mg | ORAL_TABLET | Freq: Every day | ORAL | 0 refills | Status: AC

## 2022-10-26 NOTE — Assessment & Plan Note (Signed)
Pt counseled to quit, pt not ready 

## 2022-10-26 NOTE — Progress Notes (Signed)
Patient ID: Joel Price, male   DOB: 01-15-1966, 57 y.o.   MRN: [id number]  Virtual Visit via Video Note  I connected with Franklyn Lor on 10/26/22 at  3:40 PM EDT by a video enabled telemedicine application and verified that I am speaking with the correct person using two identifiers.  Location of all participants today Patient: at home Provider: at office   I discussed the limitations of evaluation and management by telemedicine and the availability of in person appointments. The patient expressed understanding and agreed to proceed.  History of Present Illness: Here with 3 days worsening urinary frequency with mild discomfort every 30 min, but Denies urinary symptoms such as urgency, flank pain, hematuria or n/v, fever, chills.Incidentally taking doxycycline for recent URI  but not helping.  No prior hx of bladder,, prostate or dm disorder.  Pt denies chest pain, increased sob or doe, wheezing, orthopnea, PND, increased LE swelling, palpitations, dizziness or syncope.   Pt denies polydipsia, polyuria, or new focal neuro s/s.   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 His smoking use included cigarettes. He has never used smokeless tobacco. He reports current alcohol use. No history on file for drug use. family history includes Cancer in an other family member; Hypertension in his mother. No Known Allergies Current Outpatient Medications on File Prior to Visit  Medication Sig Dispense Refill   benzonatate (TESSALON) 100 MG capsule Take 1 capsule (100 mg total) by mouth 3 (three) times daily as needed for cough. 30 capsule 0   doxycycline (VIBRA-TABS) 100 MG  tablet Take 1 tablet (100 mg total) by mouth 2 (two) times daily. 20 tablet 0   No current facility-administered medications on file prior to visit.    Observations/Objective: Alert, NAD, appropriate mood and affect, resps normal, cn 2-12 intact, moves all 4s, no visible rash or swelling Lab Results  Component Value Date   WBC 7.8 09/08/2022   HGB 15.4 09/08/2022   HCT 44.9 09/08/2022   PLT 342.0 09/08/2022   GLUCOSE 105 (H) 09/08/2022   CHOL 191 07/12/2022   TRIG 174.0 (H) 07/12/2022   HDL 26.50 (L) 07/12/2022   LDLDIRECT 99.0 12/25/2017   LDLCALC 129 (H) 07/12/2022   ALT 99 (H) 09/08/2022   AST 43 (H) 09/08/2022   NA 138 09/08/2022   K 3.9 09/08/2022   CL 101 09/08/2022   CREATININE 0.91 09/08/2022   BUN 12 09/08/2022   CO2 29 09/08/2022   TSH 1.31 07/12/2022   PSA 0.34 07/12/2022   HGBA1C 5.5 07/12/2022   Assessment and Plan: See notes  Follow Up Instructions: See notes   I discussed the assessment and treatment plan with the patient. The patient was provided an opportunity to ask questions and all were answered. The patient agreed with the plan and demonstrated an understanding of the instructions.   The patient was advised to call back or seek an in-person evaluation if the symptoms worsen or if the condition fails to improve as anticipated.   Oliver Barre, MD

## 2022-10-26 NOTE — Assessment & Plan Note (Signed)
Can't r/o prostatis or uti, for ua and cx, also change doxy to levaquin 500 qd, and also bmp with lab

## 2022-10-26 NOTE — Patient Instructions (Signed)
Please take all new medication as prescribed    Please continue all other medications as before, except to stop the doxycycline  Please have the pharmacy call with any other refills you may need.  Please quit smoking  Please keep your appointments with your specialists as you may have planned \ Please go to the LAB at the blood drawing area for the tests to be done

## 2022-10-26 NOTE — Assessment & Plan Note (Signed)
Lab Results  Component Value Date   HGBA1C 5.5 07/12/2022   Stable, pt to continue current medical treatment  - diet, wt control 

## 2022-10-26 NOTE — Assessment & Plan Note (Signed)
Last vitamin D Lab Results  Component Value Date   VD25OH 13.17 (L) 07/12/2022   Low to start oral replacement

## 2022-11-03 ENCOUNTER — Telehealth (INDEPENDENT_AMBULATORY_CARE_PROVIDER_SITE_OTHER): Payer: 59 | Admitting: Internal Medicine

## 2022-11-03 DIAGNOSIS — Z87891 Personal history of nicotine dependence: Secondary | ICD-10-CM

## 2022-11-03 DIAGNOSIS — E559 Vitamin D deficiency, unspecified: Secondary | ICD-10-CM | POA: Diagnosis not present

## 2022-11-03 DIAGNOSIS — R300 Dysuria: Secondary | ICD-10-CM | POA: Diagnosis not present

## 2022-11-03 DIAGNOSIS — R739 Hyperglycemia, unspecified: Secondary | ICD-10-CM

## 2022-11-03 NOTE — Progress Notes (Signed)
Patient ID: Joel Price, male   DOB: 1966/04/08, 57 y.o.   MRN: [id number]  Virtual Visit via Video Note  I connected with Joel Price on 11/06/22 at  2:40 PM EDT by a video enabled telemedicine application and verified that I am speaking with the correct person using two identifiers.  Location of all participants Patient: at home Provider: at office   I discussed the limitations of evaluation and management by telemedicine and the availability of in person appointments. The patient expressed understanding and agreed to proceed.  History of Present Illness: Here with persistent complaint of urinary frequency and mild dysuria for over 2 wks; did have left sided pain recently he thought might be a recurrent renal stone but resolved quickly; Denies urinary symptoms such as urgency, flank pain, hematuria or n/v, fever, chills.  Pt denies chest pain, increased sob or doe, wheezing, orthopnea, PND, increased LE swelling, palpitations, dizziness or syncope.   Pt denies polydipsia, polyuria, or new focal neuro  s/s.  Denies tobacco x 3 yrs, vaping occasionally now.  Has colonoscopy coming up jun e 2024.      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 His smoking use included cigarettes. He has never used smokeless tobacco. He reports current alcohol use. No history on file for drug use. family history includes Cancer in an other family member; Hypertension in his mother. No Known Allergies Current Outpatient Medications on File Prior to Visit  Medication Sig Dispense Refill   benzonatate (TESSALON) 100 MG capsule Take 1 capsule (100 mg total) by mouth 3 (three) times daily as needed for cough. 30  capsule 0   doxycycline (VIBRA-TABS) 100 MG tablet Take 1 tablet (100 mg total) by mouth 2 (two) times daily. 20 tablet 0   No current facility-administered medications on file prior to visit.    Observations/Objective: Alert, NAD, appropriate mood and affect, resps normal, cn 2-12 intact, moves all 4s, no visible rash or swelling Lab Results  Component Value Date   WBC 7.8 09/08/2022   HGB 15.4 09/08/2022   HCT 44.9 09/08/2022   PLT 342.0 09/08/2022   GLUCOSE 105 (H) 09/08/2022   CHOL 191 07/12/2022   TRIG 174.0 (H) 07/12/2022   HDL 26.50 (L) 07/12/2022   LDLDIRECT 99.0 12/25/2017   LDLCALC 129 (H) 07/12/2022   ALT 99 (H) 09/08/2022   AST 43 (H) 09/08/2022   NA 138 09/08/2022   K 3.9 09/08/2022   CL 101 09/08/2022   CREATININE 0.91 09/08/2022   BUN 12 09/08/2022   CO2 29 09/08/2022   TSH 1.31 07/12/2022   PSA 0.34 07/12/2022   HGBA1C 5.5 07/12/2022   Assessment and Plan: See notes  Follow Up Instructions: See notes   I discussed the assessment and treatment plan with the patient. The patient was provided an opportunity to ask questions and all were answered. The patient agreed with the plan and demonstrated an understanding of the instructions.   The patient was advised to call back or seek an in-person evaluation if the symptoms worsen or if the condition fails to improve as anticipated.   Oliver Barre,  MD

## 2022-11-06 ENCOUNTER — Encounter: Payer: Self-pay | Admitting: Internal Medicine

## 2022-11-06 DIAGNOSIS — R300 Dysuria: Secondary | ICD-10-CM | POA: Insufficient documentation

## 2022-11-06 NOTE — Assessment & Plan Note (Signed)
Stable, advised pt to stop vaping however

## 2022-11-06 NOTE — Assessment & Plan Note (Signed)
Etiology unclear, for ua culture, and refer urology for persistent symptoms unclear etiology

## 2022-11-06 NOTE — Patient Instructions (Signed)
Please go to the LAB at the blood drawing area for the tests to be done - the urine testing  You will be contacted regarding the referral for: urology

## 2022-11-06 NOTE — Assessment & Plan Note (Signed)
Last vitamin D Lab Results  Component Value Date   VD25OH 13.17 (L) 07/12/2022   Low, to start oral replacement

## 2022-11-06 NOTE — Assessment & Plan Note (Signed)
Lab Results  Component Value Date   HGBA1C 5.5 07/12/2022   Stable, pt to continue current medical treatment  - diet, wt control 

## 2022-11-17 ENCOUNTER — Encounter: Payer: 59 | Admitting: Urology

## 2022-11-22 ENCOUNTER — Encounter: Payer: Self-pay | Admitting: Gastroenterology

## 2022-11-22 ENCOUNTER — Ambulatory Visit (AMBULATORY_SURGERY_CENTER): Payer: 59 | Admitting: *Deleted

## 2022-11-22 VITALS — Ht 66.0 in | Wt 190.0 lb

## 2022-11-22 DIAGNOSIS — Z1211 Encounter for screening for malignant neoplasm of colon: Secondary | ICD-10-CM

## 2022-11-22 MED ORDER — NA SULFATE-K SULFATE-MG SULF 17.5-3.13-1.6 GM/177ML PO SOLN
1.0000 | Freq: Once | ORAL | 0 refills | Status: AC
Start: 2022-11-22 — End: 2022-11-22

## 2022-11-22 NOTE — Progress Notes (Signed)
Pt's name and DOB verified at the beginning of the pre-visit.  Pt denies any difficulty with ambulating,sitting, laying down or rolling side to side Gave both [facility] main # and MD on call # prior to instructions.  No egg or soy allergy known to patient  No issues known to pt with past sedation with any surgeries or procedures Pt denies having issues being intubated Pt has no issues moving head neck or swallowing No FH of Malignant Hyperthermia Pt is not on diet pills Pt is not on home 02  Pt is not on blood thinners  Pt denies issues with constipation  Pt is not on dialysis Pt denise any abnormal heart rhythms  Pt denies any upcoming cardiac testing Pt encouraged to use to use Singlecare or Goodrx to reduce cost  Patient's chart reviewed by Cathlyn Parsons CNRA prior to pre-visit and patient appropriate for the [facility].  Pre-visit completed and red dot placed by patient's name on their procedure day (on provider's schedule).  . Visit by phone Pt states weight is 190 Instructed pt why it is important to and  to call if they have any changes in health or new medications. Directed them to the # given and on instructions.   Pt states they will.  Instructions reviewed with pt and pt states understanding. Instructed to review again prior to procedure. Pt states they will.  Instructions sent by mail and by my chart

## 2022-12-06 ENCOUNTER — Ambulatory Visit (AMBULATORY_SURGERY_CENTER): Payer: 59 | Admitting: Gastroenterology

## 2022-12-06 ENCOUNTER — Encounter: Payer: Self-pay | Admitting: Gastroenterology

## 2022-12-06 VITALS — BP 104/63 | HR 56 | Temp 97.3°F | Resp 14 | Ht 66.0 in | Wt 190.0 lb

## 2022-12-06 DIAGNOSIS — Z1211 Encounter for screening for malignant neoplasm of colon: Secondary | ICD-10-CM

## 2022-12-06 DIAGNOSIS — K635 Polyp of colon: Secondary | ICD-10-CM | POA: Diagnosis not present

## 2022-12-06 DIAGNOSIS — D123 Benign neoplasm of transverse colon: Secondary | ICD-10-CM

## 2022-12-06 MED ORDER — SODIUM CHLORIDE 0.9 % IV SOLN
500.0000 mL | Freq: Once | INTRAVENOUS | Status: DC
Start: 2022-12-06 — End: 2022-12-06

## 2022-12-06 NOTE — Progress Notes (Signed)
Pt's states no medical or surgical changes since previsit or office visit. 

## 2022-12-06 NOTE — Progress Notes (Signed)
Uneventful anesthetic. Report to pacu rn. Vss. Care resumed by rn. 

## 2022-12-06 NOTE — Progress Notes (Signed)
Called to room to assist during endoscopic procedure.  Patient ID and intended procedure confirmed with present staff. Received instructions for my participation in the procedure from the performing physician.  

## 2022-12-06 NOTE — Patient Instructions (Signed)
Discharge instructions given. Handouts on polyps and diverticulosis. Resume previous medications. YOU HAD AN ENDOSCOPIC PROCEDURE TODAY AT THE [facility]:   Refer to the procedure report that was given to you for any specific questions about what was found during the examination.  If the procedure report does not answer your questions, please call your gastroenterologist to clarify.  If you requested that your care partner not be given the details of your procedure findings, then the procedure report has been included in a sealed envelope for you to review at your convenience later.  YOU SHOULD EXPECT: Some feelings of bloating in the abdomen. Passage of more gas than usual.  Walking can help get rid of the air that was put into your GI tract during the procedure and reduce the bloating. If you had a lower endoscopy (such as a colonoscopy or flexible sigmoidoscopy) you may notice spotting of blood in your stool or on the toilet paper. If you underwent a bowel prep for your procedure, you may not have a normal bowel movement for a few days.  Please Note:  You might notice some irritation and congestion in your nose or some drainage.  This is from the oxygen used during your procedure.  There is no need for concern and it should clear up in a day or so.  SYMPTOMS TO REPORT IMMEDIATELY:  Following lower endoscopy (colonoscopy or flexible sigmoidoscopy):  Excessive amounts of blood in the stool  Significant tenderness or worsening of abdominal pains  Swelling of the abdomen that is new, acute  Fever of 100F or higher   For urgent or emergent issues, a gastroenterologist can be reached at any hour by calling [phone]. Do not use MyChart messaging for urgent concerns.    DIET:  We do recommend a small meal at first, but then you may proceed to your regular diet.  Drink plenty of fluids but you should avoid alcoholic beverages for 24 hours.  ACTIVITY:  You should plan to take it  easy for the rest of today and you should NOT DRIVE or use heavy machinery until tomorrow (because of the sedation medicines used during the test).    FOLLOW UP: Our staff will call the number listed on your records the next business day following your procedure.  We will call around 7:15- 8:00 am to check on you and address any questions or concerns that you may have regarding the information given to you following your procedure. If we do not reach you, we will leave a message.     If any biopsies were taken you will be contacted by phone or by letter within the next 1-3 weeks.  Please call us at [phone] if you have not heard about the biopsies in 3 weeks.    SIGNATURES/CONFIDENTIALITY: You and/or your care partner have signed paperwork which will be entered into your electronic medical record.  These signatures attest to the fact that that the information above on your After Visit Summary has been reviewed and is understood.  Full responsibility of the confidentiality of this discharge information lies with you and/or your care-partner. 

## 2022-12-06 NOTE — Progress Notes (Signed)
History and Physical:  This patient presents for endoscopic testing for: Encounter Diagnosis  Name Primary?   Special screening for malignant neoplasms, colon Yes    Average risk for colorectal cancer.  First screening exam.  Patient denies chronic abdominal pain, rectal bleeding, constipation or diarrhea.    Patient is otherwise without complaints or active issues today.   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   Current Facility-Administered Medications  Medication Dose Route Frequency Provider Last Rate Last Admin   0.9 %  sodium chloride infusion  500 mL Intravenous Once Danis, Starr Lake III, MD          ___________________________________________________________________ Objective   Exam:  BP 123/80   Temp (!) 97.3 F (36.3 C) (Temporal)   Ht 5\' 6"  (1.676 m)   Wt 190 lb (86.2 kg)   SpO2 98%   BMI 30.67 kg/m   CV: regular , S1/S2 Resp: clear to auscultation bilaterally, normal RR and effort noted GI: soft, no tenderness, with active bowel sounds.   Assessment: Encounter Diagnosis  Name Primary?   Special screening for malignant neoplasms, colon Yes     Plan: Colonoscopy   The benefits and risks of the planned procedure were described in detail with the patient or (when appropriate) their health care proxy.  Risks were outlined as including, but not limited to, bleeding, infection, perforation, adverse medication reaction leading to cardiac or pulmonary  decompensation, pancreatitis (if ERCP).  The limitation of incomplete mucosal visualization was also discussed.  No guarantees or warranties were given.  The patient is appropriate for an endoscopic procedure in the ambulatory setting.   - Amada Jupiter, MD

## 2022-12-06 NOTE — Op Note (Signed)
[facility] Patient Name: Joel Price Procedure Date: 12/06/2022 9:59 AM MRN: [id number] Endoscopist: Sherilyn Cooter L. Myrtie Neither , MD, [id number] Age: 57 Referring MD:  Date of Birth: Feb 04, 1966 Gender: Male Account #: [id number] Procedure:                Colonoscopy Indications:              Screening for colorectal malignant neoplasm, This                            is the patient's first colonoscopy Medicines:                Monitored Anesthesia Care Procedure:                Pre-Anesthesia Assessment:                           - Prior to the procedure, a History and Physical                            was performed, and patient medications and                            allergies were reviewed. The patient's tolerance of                            previous anesthesia was also reviewed. The risks                            and benefits of the procedure and the sedation                            options and risks were discussed with the patient.                            All questions were answered, and informed consent                            was obtained. Prior Anticoagulants: The patient has                            taken no anticoagulant or antiplatelet agents. ASA                            Grade Assessment: II - A patient with mild systemic                            disease. After reviewing the risks and benefits,                            the patient was deemed in satisfactory condition to                            undergo the procedure.  After obtaining informed consent, the colonoscope                            was passed under direct vision. Throughout the                            procedure, the patient's blood pressure, pulse, and                            oxygen saturations were monitored continuously. The                            CF [id number] #[id number] was introduced through the anus                            and advanced to the the  cecum, identified by                            appendiceal orifice and ileocecal valve. The                            colonoscopy was performed without difficulty. The                            patient tolerated the procedure well. The quality                            of the bowel preparation was excellent. The                            ileocecal valve, appendiceal orifice, and rectum                            were photographed. Scope In: 10:10:50 AM Scope Out: 10:27:09 AM Scope Withdrawal Time: 0 hours 12 minutes 3 seconds  Total Procedure Duration: 0 hours 16 minutes 19 seconds  Findings:                 The perianal and digital rectal examinations were                            normal.                           Repeat examination of right colon under NBI                            performed.                           A diminutive polyp was found in the transverse                            colon. The polyp was semi-sessile. The polyp was  removed with a cold snare. Resection and retrieval                            were complete.                           Diverticula were found in the left colon.                           Internal hemorrhoids were found.                           The exam was otherwise without abnormality on                            direct and retroflexion views. Complications:            No immediate complications. Estimated Blood Loss:     Estimated blood loss was minimal. Impression:               - One diminutive polyp in the transverse colon,                            removed with a cold snare. Resected and retrieved.                           - Diverticulosis in the left colon.                           - Internal hemorrhoids.                           - The examination was otherwise normal on direct                            and retroflexion views. Recommendation:           - Patient has a contact number available for                             emergencies. The signs and symptoms of potential                            delayed complications were discussed with the                            patient. Return to normal activities tomorrow.                            Written discharge instructions were provided to the                            patient.                           - Resume previous diet.                           -  Continue present medications.                           - Await pathology results.                           - Repeat colonoscopy is recommended for                            surveillance. The colonoscopy date will be                            determined after pathology results from today's                            exam become available for review. Daion Ginsberg L. Myrtie Neither, MD 12/06/2022 10:29:52 AM This report has been signed electronically.

## 2022-12-07 ENCOUNTER — Telehealth: Payer: Self-pay

## 2022-12-07 NOTE — Telephone Encounter (Signed)
  Follow up Call-     12/06/2022    9:36 AM  Call back number  Post procedure Call Back phone  # [phone]  Permission to leave phone message Yes     Patient questions:  Do you have a fever, pain , or abdominal swelling? No. Pain Score  0 *  Have you tolerated food without any problems? Yes.    Have you been able to return to your normal activities? Yes.    Do you have any questions about your discharge instructions: Diet   No. Medications  No. Follow up visit  No.  Do you have questions or concerns about your Care? No.  Actions: * If pain score is 4 or above: No action needed, pain <4.

## 2022-12-09 ENCOUNTER — Encounter: Payer: Self-pay | Admitting: Gastroenterology

## 2023-01-07 LAB — LAB REPORT - SCANNED

## 2023-01-10 ENCOUNTER — Encounter: Payer: Self-pay | Admitting: Internal Medicine

## 2023-01-10 ENCOUNTER — Ambulatory Visit: Payer: 59 | Admitting: Internal Medicine

## 2023-01-10 VITALS — BP 120/78 | HR 83 | Temp 98.7°F | Ht 66.0 in | Wt 191.0 lb

## 2023-01-10 DIAGNOSIS — E559 Vitamin D deficiency, unspecified: Secondary | ICD-10-CM

## 2023-01-10 DIAGNOSIS — R1011 Right upper quadrant pain: Secondary | ICD-10-CM

## 2023-01-10 DIAGNOSIS — E7800 Pure hypercholesterolemia, unspecified: Secondary | ICD-10-CM

## 2023-01-10 DIAGNOSIS — R739 Hyperglycemia, unspecified: Secondary | ICD-10-CM | POA: Diagnosis not present

## 2023-01-10 NOTE — Patient Instructions (Signed)
Please continue all other medications as before, and refills have been done if requested.  Please have the pharmacy call with any other refills you may need.  Please continue your efforts at being more active, low cholesterol diet, and weight control.  Please keep your appointments with your specialists as you may have planned - general surgury

## 2023-01-10 NOTE — Assessment & Plan Note (Signed)
Lab Results  Component Value Date   LDLCALC 129 (H) 07/12/2022   Uncontrolled,, pt for lower chol diet, declines statin for now

## 2023-01-10 NOTE — Assessment & Plan Note (Signed)
Lab Results  Component Value Date   HGBA1C 5.5 07/12/2022   Stable, pt to continue current medical treatment  - diet, wt control

## 2023-01-10 NOTE — Assessment & Plan Note (Signed)
Last vitamin D Lab Results  Component Value Date   VD25OH 13.17 (L) 07/12/2022   Low, to start oral replacement

## 2023-01-10 NOTE — Progress Notes (Signed)
Patient ID: Joel Price, male   DOB: Apr 16, 1966, 57 y.o.   MRN: [id number]        Chief Complaint: follow up abd pain       HPI:  Joel Price is a 57 y.o. male here with c/o acute right sided abd pain more ruq, with n/v but Denies worsening reflux, abd pain, dysphagia or blood, but did also have some recent constipation.  Pt had labd, abd u//s and abd/pelvic Ct by report,at [facility] (no records to review) and all neg per pt  He has d/c paper suggesting biliary colic and has been referred to a general surgury assoc with that hospital.  He was not too sure of this, so comes here.  Pain currently much improved today gradaully it seems, and he is wondering if the pain was not more msk strain.  Pt denies chest pain, increased sob or doe, wheezing, orthopnea, PND, increased LE swelling, palpitations, dizziness or syncope.   Pt denies polydipsia, polyuria, or new focal neuro s/s.         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 His smoking use included cigarettes. He has never used smokeless tobacco. He reports current alcohol use. He reports that he does not use drugs. family history includes Cancer in an other family member; Hypertension in his mother. Allergies  Allergen Reactions   Dilaudid [Hydromorphone] Nausea And Vomiting   Current Outpatient Medications on File Prior to Visit  Medication Sig Dispense Refill    ondansetron (ZOFRAN-ODT) 4 MG disintegrating tablet Take 4 mg by mouth every 6 (six) hours as needed. (Patient not taking: Reported on 01/10/2023)     oxyCODONE (OXY IR/ROXICODONE) 5 MG immediate release tablet Take by mouth. (Patient not taking: Reported on 01/10/2023)     PERCOCET 5-325 MG tablet SMARTSIG:1 Each By Mouth Every 4 Hours (Patient not taking: Reported on 01/10/2023)     No current facility-administered medications on file prior to visit.        ROS:  All others reviewed and negative.  Objective        PE:  BP 120/78 (BP Location: Right Arm, Patient Position: Sitting, Cuff Size: Normal)   Pulse 83   Temp 98.7 F (37.1 C) (Oral)   Ht 5\' 6"  (1.676 m)   Wt 191 lb (86.6 kg)   SpO2 98%   BMI 30.83 kg/m                 Constitutional: Pt appears in NAD               HENT: Head: NCAT.                Right Ear: External ear normal.  Left Ear: External ear normal.                Eyes: . Pupils are equal, round, and reactive to light. Conjunctivae and EOM are normal               Nose: without d/c or deformity               Neck: Neck supple. Gross normal ROM               Cardiovascular: Normal rate and regular rhythm.                 Pulmonary/Chest: Effort normal and breath sounds without rales or wheezing.                Abd:  Soft, NT, ND, + BS, no organomegaly               Neurological: Pt is alert. At baseline orientation, motor grossly intact               Skin: Skin is warm. No rashes, no other new lesions, LE edema - none               Psychiatric: Pt behavior is normal without agitation   Micro: none  Cardiac tracings I have personally interpreted today:  none  Pertinent Radiological findings (summarize): none   Lab Results  Component Value Date   WBC 7.8 09/08/2022   HGB 15.4 09/08/2022   HCT 44.9 09/08/2022   PLT 342.0 09/08/2022   GLUCOSE 105 (H) 09/08/2022   CHOL 191 07/12/2022   TRIG 174.0 (H) 07/12/2022   HDL 26.50 (L) 07/12/2022    LDLDIRECT 99.0 12/25/2017   LDLCALC 129 (H) 07/12/2022   ALT 99 (H) 09/08/2022   AST 43 (H) 09/08/2022   NA 138 09/08/2022   K 3.9 09/08/2022   CL 101 09/08/2022   CREATININE 0.91 09/08/2022   BUN 12 09/08/2022   CO2 29 09/08/2022   TSH 1.31 07/12/2022   PSA 0.34 07/12/2022   HGBA1C 5.5 07/12/2022   Assessment/Plan:  Joel Price is a 57 y.o. White or Caucasian [1] male with  has a past medical history of BACK PAIN (03/28/2008), Blurred vision, left eye (12/06/2010), Chronic kidney disease, NEPHROLITHIASIS, HX OF (03/28/2008), and SCROTAL MASS (03/28/2008).  Abdominal pain More right sided by hx, exam benign today, pt states ED visit July 20 with negative labs, u/s and CT, suggested to have biliary colic, and I urged pt to f/u with general surgury to which he has been referred as records should be readily available  Vitamin D deficiency Last vitamin D Lab Results  Component Value Date   VD25OH 13.17 (L) 07/12/2022   Low, to start oral replacement   Hyperglycemia Lab Results  Component Value Date   HGBA1C 5.5 07/12/2022   Stable, pt to continue current medical treatment  - diet, wt control   Hyperlipidemia Lab Results  Component Value Date   LDLCALC 129 (H) 07/12/2022   Uncontrolled,, pt for lower chol diet, declines statin for now  Followup: Return if symptoms worsen or fail to improve.  Oliver Barre, MD 01/10/2023 7:53 PM [facility]

## 2023-01-10 NOTE — Assessment & Plan Note (Addendum)
More right sided by hx, exam benign today, pt states ED visit July 20 with negative labs, u/s and CT, suggested to have biliary colic, and I urged pt to f/u with general surgury to which he has been referred as records should be readily available

## 2023-01-11 ENCOUNTER — Encounter: Payer: Self-pay | Admitting: Internal Medicine

## 2023-01-13 NOTE — Telephone Encounter (Signed)
Per chart report was document 01/11/23.Marland KitchenRaechel Price

## 2023-01-13 NOTE — Telephone Encounter (Signed)
Ok the lab and imaging reports have been reviewed.  Pt already has a referral to General Surgury in place for an MD outside ot [place], but I can refer to one in [place] if he likes.  Just let me know thanks

## 2023-02-02 ENCOUNTER — Encounter (INDEPENDENT_AMBULATORY_CARE_PROVIDER_SITE_OTHER): Payer: Self-pay

## 2023-03-09 ENCOUNTER — Ambulatory Visit: Payer: 59 | Admitting: Internal Medicine

## 2023-03-09 ENCOUNTER — Encounter: Payer: Self-pay | Admitting: Internal Medicine
